# Patient Record
Sex: Female | Born: 1935 | Race: White | Hispanic: No | Marital: Single
Health system: Midwestern US, Academic
[De-identification: ages and names within clinical notes are randomized; demographics above are authoritative.]

---

## 2016-09-16 ENCOUNTER — Encounter: Admit: 2016-09-16 | Discharge: 2016-09-16 | Payer: MEDICARE | Primary: Family

## 2016-09-16 ENCOUNTER — Ambulatory Visit: Admit: 2016-09-16 | Discharge: 2016-09-17 | Payer: MEDICARE | Primary: Family

## 2016-09-16 DIAGNOSIS — K219 Gastro-esophageal reflux disease without esophagitis: ICD-10-CM

## 2016-09-16 DIAGNOSIS — I10 Essential (primary) hypertension: Principal | ICD-10-CM

## 2016-09-16 MED ORDER — HYDROCHLOROTHIAZIDE 25 MG PO TAB
25 mg | ORAL_TABLET | Freq: Every morning | ORAL | 3 refills | 28.00000 days | Status: AC
Start: 2016-09-16 — End: 2017-10-05

## 2016-09-16 NOTE — Progress Notes
Date of Service: 09/16/2016    Carmen Jones is a 82 y.o. female.       HPI     I had the pleasure of seeing Carmen Jones today for reevaluation of her hypertension.  She has a history of coronary artery disease post angioplasty and stenting of her LAD after presenting with a non-STEMI in 2013, hypercholesterolemia, palpitations, and insomnia.  She has been seen in our office several times try to get her blood pressure controlled.    At her last office visit Jul 10, 2016 her blood pressure was well controlled.  She continued to have a small amount of lower leg edema and was working hard at a low sodium diet.  She was also continuing to work at weight loss.  She also has a history of GERD.  This spring she had an endoscopy and colonoscopy and was told she had a slight erosion due to her hiatal hernia and was placed on Carafate.  She was asked to take an extra hydrochlorothiazide 12.5 mg if she had an increase in her lower leg edema.  She was also reminded to let our office know if she was taking this extra dose more than twice a week as we would check a BMP.    Carmen Jones called our office today to let us know that for the past several weeks she has been having blood pressures ranging in the 140s and 150s.  She was asked to come into the office for further evaluation.    Upon presentation to the office today Carmen Jones brought her home blood pressure records indeed showing that she does have frequent blood pressures ranging in the 140s and 150s systolic.  She notes she always sits and rests for 10 minutes before checking her blood pressure.  She often wakes up early in the morning around 430 or 530 and checks her blood pressure before she gets out of bed.  These tend to be some of her highest blood pressure readings.  She tells me that she is back at work in [employer] office because they have been short staffed.  She tells me is kind of a bothersome job.  There is a lot to do.  She also has not been exercising.  She does get about 2 miles of walking in a day just in a few steps here and there.  She does walk up 2 flights to her room and tolerates that fairly well.    She tells me that she very seldom has a mild heaviness across her chest.  The other day she had been out for dinner and when she was driving home she noticed a mild heaviness across her chest.  She tells me it feels a little like apprehension.  It did not take her breath away, cause nausea or diaphoresis.  She rated it as a 4-5 out of 10 in intensity.  She tells me she sat in her chair and rested and after about 45 minutes to just eased off.  She tells me it felt like indigestion just a little higher.  She notes that when she does have indigestion she takes an antacid and it calms down the feeling right away.  She tells me that was once she did have a heaviness like this in her chest that did make her a little short of air.  She continues to have some lower leg edema and tells me is a little worse than usual but she just forgot that she could take  an extra hydrochlorothiazide if needed.  She tells me she has not had any falls or hospitalizations since her last office visit    Assessment and plan    1.  Hypertension???elevated.  She is having fairly frequent blood pressures elevated into the 140s and 150s systolic.  She has been asked to increase her hydrochlorothiazide from 12.5 mg daily to 25 mg daily.  She has been given a lab order to check a BMP in 1 week.  She is not currently on potassium.  She is on losartan 100 mg daily.  We may need to add a potassium supplement depending on her lab results.  We again reviewed the importance of monitoring her sodium intake and keeping it less than 2000 mg daily.    2.  Coronary artery disease.  She has had a few mild episodes of heaviness across her chest.  They come on at rest.  She tells me it does feel like indigestion that she has not tried antacids to see if they would help. These episodes will be discussed with [name] and we may consider scheduling her for a stress test to evaluate for progression of coronary artery disease.  She continues on aspirin, atenolol, and losartan.  Her last regadenoson thallium stress test from August 2014 revealed an EF of 87% and no significant ischemia.    3.  Hyperlipidemia.  Her next labs will be due in October.  She continues on pravastatin 40 mg daily and tells me she is tolerating it without any problems.    4.  Lower leg edema???increased the increased hydrochlorothiazide should help improve her edema.  She will also work harder at a low sodium diet.    She is scheduled to follow-up in our office with [name] on August 17.  She will keep this appointment.    Thank you for the opportunity to participate in the care of this patient.  Please feel free to call us if you have any questions or concerns.    Loraine Leriche, APRN-C  DR[facility]       Vitals:    09/16/16 1351 09/16/16 1358   BP: 124/76 126/74   Pulse: 78    Weight: 78.1 kg (172 lb 3.2 oz)    Height: 1.676 m (5' 6)      Body mass index is 27.79 kg/m???.     Past Medical History  Patient Active Problem List    Diagnosis Date Noted   ??? Primary insomnia 11/27/2014   ??? Mixed hyperlipidemia 11/15/2013   ??? Arteriosclerotic coronary artery disease 12/01/2011     a.  10/2011  Chest pain.  transfer form [facility] with NSTEMI          Angiogram: LAD mid 95%, LCX-OMB1 40%, dom RCA 95%.     PCI with 3.0x18 Xience DES in LAD and 3.0x15 Xience DES in RCA       08/14 regadenoson thall:  EF 87%, no ischemia, normal scan     ??? NSTEMI (non-ST elevated myocardial infarction) (HCC) 11/06/2011   ??? Essential hypertension 11/06/2011   ??? GERD (gastroesophageal reflux disease) 11/06/2011         Review of Systems   Constitution: Negative.   HENT: Negative.    Eyes: Negative.    Cardiovascular: Positive for leg swelling.   Respiratory: Negative.    Endocrine: Negative.    Hematologic/Lymphatic: Negative. Skin: Negative.    Musculoskeletal: Negative.    Gastrointestinal: Negative.    Genitourinary: Negative.  Neurological: Negative.    Psychiatric/Behavioral: Negative.    Allergic/Immunologic: Negative.    She denies having palpitations, PND, orthopnea, lightheadedness, dizziness, pre-syncope, syncope, coughing, wheezing, shortness of air, dyspnea, sputum production, or hemoptysis. She uses no tobacco products.      Physical Exam      General Appearance: resting comfortably, no acute distress  Skin: warm, moist, no ulcers or xanthomas  Digits and Nails: no clubbing  Eyes: conjunctivae and lids normal, pupils are equal and round  Lips & Oral Mucosa: no pallor or cyanosis  Ear, Nose, Throat: No deformities  Neck Veins: neck veins are flat, neck veins are not distended  Thyroid: no nodules, masses, tenderness or enlargement  Chest Inspection: chest is normal in appearance  Respiratory Effort: breathing is unlabored, no respiratory distress  Auscultation/Percussion: lungs clear to auscultation, no rales, rhonchi, or wheezing  PMI: PMI not enlarged or displaced  Cardiac Rhythm: regular rhythm and normal rate  Cardiac Auscultation: Normal S1 & S2, no S3 or S4, no rub  Murmurs: no cardiac murmurs   Carotid Arteries: normal carotid upstroke bilaterally, no bruits  Pedal Pulses: normal symmetric pedal pulses  Lower Extremity Edema: scant bilateral lower extremity edema  Abdominal Exam: soft, non-tender, no masses, bowel sounds normal  Abdominal Aorta: nonpalpable abdominal aorta; no abdominal bruits  Liver & Spleen: no organomegaly  Gait & Station: normal balance and gait  Muscle Strength: normal strength and tone  Neurologic Exam: neurological assessment grossly intact  Orientation: oriented to time, place and person  Affect & Mood: appropriate and sustained affect  Other: moves all extremities            Problems Addressed Today  No diagnosis found.                  Current Medications (including today's revisions) ??? aspirin EC 81 mg tablet Take 81 mg by mouth daily.   ??? atenolol (TENORMIN) 25 mg tablet Take 0.5 tablets by mouth at bedtime daily.   ??? cholecalciferol (Vitamin D3) (VITAMIN D-3) 1,000 units tablet Take 1,000 Units by mouth daily.   ??? docusate (COLACE) 100 mg capsule Take 200 mg by mouth every morning.   ??? esomeprazole DR(+) (NEXIUM) 40 mg capsule Take 40 mg by mouth every morning. Take on an empty stomach at least 1 hour before or 2 hours after food.   ??? felodipine(+) (PLENDIL) 5 mg ER tablet TAKE 1 TABLET BY MOUTH DAILY. TAKE ON AN EMPTY STOMACH.   ??? ferrous sulfate (FEOSOL, FEROSUL) 325 mg (65 mg iron) tablet Take 325 mg by mouth daily.   ??? hydroCHLOROthiazide (HYDRODIURIL) 25 mg tablet Take 12.5 mg by mouth every morning.   ??? IBUPROFEN (ADVIL PO) Take  by mouth as Needed.   ??? losartan (COZAAR) 50 mg tablet Take 1 tablet by mouth twice daily.   ??? magnesium oxide (MAG-OX) 400 mg tablet Take 400 mg by mouth twice daily.   ??? NAPROXEN SODIUM (ALEVE PO) Take  by mouth as Needed.   ??? pravastatin (PRAVACHOL) 40 mg tablet TAKE 1 TABLET BY MOUTH EVERY DAY   ??? temazepam (RESTORIL) 15 mg capsule Take 15 mg by mouth at bedtime as needed.

## 2016-09-16 NOTE — Telephone Encounter
Received msg on triage line from pt stating APRN Fannie Knee asked pt to call if she had any problems with her blood pressure and it has been running high and she wanted to see Fannie Knee in f/u. Pt mentions she does have upcoming OV with [name] [facility]) and if she cannot get in with Fannie Knee prior to that appt, then she will just keept that appt. Pt left home # for call back.     Spoke to Brittinie Sare in f/u and she is agreeable to come to our [place] office today to see Fannie Knee at 2 PM. She states her BP has been running higher the last few weeks and "I probably should have called sooner." She denies any med changes and/or hospitalizations since we saw her last in May. Pt states BP 150s systolic "and the bottom # is fine." Pt to bring home BP log with her for APRN review. Pt verbalized understanding and was agreeable to this plan. No further needs identified at this time.

## 2016-09-23 LAB — BASIC METABOLIC PANEL
Lab: 0.7
Lab: 10
Lab: 102
Lab: 13
Lab: 131 — ABNORMAL LOW (ref 136–145)
Lab: 15 — ABNORMAL HIGH (ref 0–14)
Lab: 26
Lab: 4.2
Lab: 76
Lab: 87
Lab: 94 — ABNORMAL LOW (ref 98–107)

## 2016-09-25 ENCOUNTER — Encounter: Admit: 2016-09-25 | Discharge: 2016-09-25 | Payer: MEDICARE | Primary: Family

## 2016-09-25 DIAGNOSIS — R0789 Other chest pain: Principal | ICD-10-CM

## 2016-09-25 NOTE — Telephone Encounter
Talked with pt.  I have concern for a 10:15 appointment time and then having enough time to do a nuc reg afterwards.  Pt has agreed to move her OV time to 0845 with [name].  We will put her on the nuc schedule for 0930.      She was given regadenoson MPI explanation and preparation instructions and she verbalized understanding.

## 2016-09-25 NOTE — Telephone Encounter
-----   Message from Courtney Heys, RN sent at 09/25/2016 12:55 PM CDT -----  Regarding: FW: Please call pt and instruct her for a Reg Thallium stress test      ----- Message -----  From: Harle Battiest, APRN  Sent: 09/25/2016  12:02 PM  To: Suzanne Boron Nurse [place]  Subject: Please call pt and instruct her for a Reg Th#    I have called pt and let her know we will schedule her for a Reg Thallium stress test on 10/03/16 right after her OV with DRB to eval her CP. She is in agreement.    She has not had improvement in her lower leg edema with the increased HCTZ dose. I asked her to begin wearing support hose.    Please call her and give Reg thallium instructions. I have put in the order.  Fayette Pho  ----- Message -----  From: Roena Malady, MD  Sent: 09/22/2016   8:17 AM  To: Harle Battiest, APRN  Subject: RE: Do you recommend a Reg Thallium stress t#    Go ahead and order the reg thall after my OV same day    ----- Message -----  From: Harle Battiest, APRN  Sent: 09/16/2016   2:49 PM  To: Roena Malady, MD  Subject: Do you recommend a Reg Thallium stress test?     Thedore Mins,    I saw Britlyn today as a work in for HTN. She has seldom episodes of mild chest heaviness. She will see you 10/03/16 for regular f/u.    I did not order a stress test but wanted you to review. Do you want me to go ahead and order it or wait and eval when you see her? Symptoms are mild, she also has GERD, but has a hx of CAD post stents.    Collie Siad

## 2016-10-03 ENCOUNTER — Encounter: Admit: 2016-10-03 | Discharge: 2016-10-03 | Payer: MEDICARE | Primary: Family

## 2016-10-03 ENCOUNTER — Ambulatory Visit: Admit: 2016-10-03 | Discharge: 2016-10-04 | Payer: MEDICARE | Primary: Family

## 2016-10-03 DIAGNOSIS — R0789 Other chest pain: Principal | ICD-10-CM

## 2016-10-03 DIAGNOSIS — E782 Mixed hyperlipidemia: ICD-10-CM

## 2016-10-03 DIAGNOSIS — I2510 Atherosclerotic heart disease of native coronary artery without angina pectoris: Principal | ICD-10-CM

## 2016-10-03 DIAGNOSIS — I10 Essential (primary) hypertension: ICD-10-CM

## 2016-10-03 DIAGNOSIS — K219 Gastro-esophageal reflux disease without esophagitis: ICD-10-CM

## 2016-10-03 MED ORDER — NITROGLYCERIN 0.4 MG SL SUBL
.4 mg | SUBLINGUAL | 0 refills | Status: AC | PRN
Start: 2016-10-03 — End: ?

## 2016-10-03 MED ORDER — SODIUM CHLORIDE 0.9 % IV SOLP
250 mL | INTRAVENOUS | 0 refills | Status: AC | PRN
Start: 2016-10-03 — End: ?

## 2016-10-03 MED ORDER — ALBUTEROL SULFATE 90 MCG/ACTUATION IN HFAA
2 | RESPIRATORY_TRACT | 0 refills | Status: DC | PRN
Start: 2016-10-03 — End: 2016-10-08

## 2016-10-03 MED ORDER — REGADENOSON 0.4 MG/5 ML IV SYRG
.4 mg | Freq: Once | INTRAVENOUS | 0 refills | Status: CP
Start: 2016-10-03 — End: ?

## 2016-10-03 MED ORDER — AMINOPHYLLINE 500 MG/20 ML IV SOLN
50 mg | INTRAVENOUS | 0 refills | Status: AC | PRN
Start: 2016-10-03 — End: ?

## 2016-10-03 NOTE — Progress Notes
Peripheral IV Insertion Note:  Patient Side: left  Line Orientation:Hand  IV Catheter Size: 22G  Number of Attempts:1.  IV capped and flushed with Normal Saline.  IV site without redness, swelling, or pain.  New dressing placed.    After procedure IV cannula removed intact and hemostasis achieved.

## 2016-10-03 NOTE — Progress Notes
Date of Service: 10/03/2016    Carmen Jones is a 82 y.o. female.       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 ???    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  She is asked to be experiencing some chest pain.  She was seen a couple of times recently by 1 of our nurse practitioner for control of her blood pressure which is now better.  With this she is also experiencing some chest pain.  The pain tends to come when she is more stressed.  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  She also stressful about the illness and some of her friends.  When she is worried about these things she gets a burning type of pain or hurting across the left side of her chest.  When more severe goes into her breast region.  She has not had the symptoms with exertion.  She has not had syncope or near syncope    She has a history of coronary disease.  She underwent angioplasty and stenting of her LAD after and in STEMI in 2013.  Her last stress was negative in 2014.  Reaction symptoms were not repeated her stress test.  Her risk factor control has been excellent.    Recent ECG [place] office was unremarkable    Impression  1.  Chest pain.  Concerning for possible recurrence of angina.  Injury be induced by stress and anxiety.  2.  Coronary disease with previous intervention 2013  3.  Hypertension not controlled  4.  Hypertension controlled    Recommendations  A thallium scan was scheduled for today.  Will call her with results.       Vitals:    10/03/16 0840   BP: 132/80   Pulse: 71   Weight: 75.8 kg (167 lb)   Height: 1.676 m (5' 6)     Body mass index is 26.95 kg/m???.     Past Medical History  Patient Active Problem List Diagnosis Date Noted   ??? Other chest pain 09/25/2016   ??? Primary insomnia 11/27/2014   ??? Mixed hyperlipidemia 11/15/2013   ??? Arteriosclerotic coronary artery disease 12/01/2011     a.  10/2011  Chest pain.  transfer [facility] with NSTEMI          Angiogram: LAD mid 95%, LCX-OMB1 40%, dom RCA 95%.     PCI with 3.0x18 Xience DES in LAD and 3.0x15 Xience DES in RCA       08/14 regadenoson thall:  EF 87%, no ischemia, normal scan     ??? NSTEMI (non-ST elevated myocardial infarction) (HCC) 11/06/2011   ??? Essential hypertension 11/06/2011   ??? GERD (gastroesophageal reflux disease) 11/06/2011         Review of Systems   Constitution: Negative.   HENT: Negative.    Eyes: Negative.    Cardiovascular: Negative.    Respiratory: Negative.    Endocrine: Negative.    Hematologic/Lymphatic: Negative.    Skin: Negative.    Musculoskeletal: Negative.    Gastrointestinal: Negative.    Genitourinary: Negative.    Neurological: Negative.    Psychiatric/Behavioral: Negative.    Allergic/Immunologic: Negative.  Physical Exam   General Appearance:???no acute distress   Skin:???warm, moist, no ulcers   HEENT:???unremarkable   Neck Veins:???neck veins are flat, neck veins are not distended   Carotid Arteries:???normal carotid upstroke bilaterally, no bruits   Chest Inspection:???chest is normal in appearance   Auscultation/Percussion:???lungs clear to auscultation, no rales, rhonchi, or wheezing   Cardiac Rhythm:???regular rhythm and normal rate   Cardiac Auscultation:???Normal S1 &???S2, no S3 or S4, no rub   Murmurs:???no cardiac murmurs   Extremities:???no lower extremity edema; 2+ symmetric distal pulses   Abdominal Exam:???soft, non-tender, no masses, bowel sounds normal   Liver & Spleen:???no organomegaly   Neurologic Exam:???neurological assessment grossly intact        Current Medications (including today's revisions)  ??? aspirin EC 81 mg tablet Take 81 mg by mouth daily.   ??? atenolol (TENORMIN) 25 mg tablet Take 0.5 tablets by mouth at bedtime daily.   ??? cholecalciferol (Vitamin D3) (VITAMIN D-3) 1,000 units tablet Take 1,000 Units by mouth daily.   ??? docusate (COLACE) 100 mg capsule Take 200 mg by mouth every morning.   ??? esomeprazole DR(+) (NEXIUM) 40 mg capsule Take 40 mg by mouth every morning. Take on an empty stomach at least 1 hour before or 2 hours after food.   ??? felodipine(+) (PLENDIL) 5 mg ER tablet TAKE 1 TABLET BY MOUTH DAILY. TAKE ON AN EMPTY STOMACH.   ??? ferrous sulfate (FEOSOL, FEROSUL) 325 mg (65 mg iron) tablet Take 325 mg by mouth daily.   ??? hydroCHLOROthiazide (HYDRODIURIL) 25 mg tablet Take 1 tablet by mouth every morning.   ??? IBUPROFEN (ADVIL PO) Take  by mouth as Needed.   ??? losartan (COZAAR) 50 mg tablet Take 1 tablet by mouth twice daily.   ??? magnesium oxide (MAG-OX) 400 mg tablet Take 400 mg by mouth twice daily.   ??? NAPROXEN SODIUM (ALEVE PO) Take  by mouth as Needed.   ??? pravastatin (PRAVACHOL) 40 mg tablet TAKE 1 TABLET BY MOUTH EVERY DAY   ??? temazepam (RESTORIL) 15 mg capsule Take 15 mg by mouth at bedtime as needed.

## 2016-10-07 ENCOUNTER — Encounter: Admit: 2016-10-07 | Discharge: 2016-10-07 | Payer: MEDICARE | Primary: Family

## 2016-10-07 NOTE — Telephone Encounter
-----   Message from Loraine Leriche, APRN sent at 10/06/2016  5:09 PM CDT -----  Nurses,  Please let Naydelin know thallium stress test looks good.    Dak,  Thanks for sharing Keeshia with me!    Fannie Knee  ----- Message -----  From: Milon Score, MD  Sent: 10/06/2016   1:31 PM  To: Loraine Leriche, APRN

## 2016-10-07 NOTE — Telephone Encounter
Results and recommendations called to patient left detailed message on phone requested call back if questions.

## 2016-10-14 ENCOUNTER — Encounter: Admit: 2016-10-14 | Discharge: 2016-10-14 | Payer: MEDICARE | Primary: Family

## 2016-11-18 ENCOUNTER — Encounter: Admit: 2016-11-18 | Discharge: 2016-11-18 | Payer: MEDICARE | Primary: Family

## 2016-11-18 DIAGNOSIS — I10 Essential (primary) hypertension: Principal | ICD-10-CM

## 2016-12-04 ENCOUNTER — Encounter: Admit: 2016-12-04 | Discharge: 2016-12-04 | Payer: MEDICARE | Primary: Family

## 2016-12-04 ENCOUNTER — Ambulatory Visit: Admit: 2016-12-04 | Discharge: 2016-12-05 | Payer: MEDICARE | Primary: Family

## 2016-12-04 DIAGNOSIS — I10 Essential (primary) hypertension: Principal | ICD-10-CM

## 2016-12-04 DIAGNOSIS — K219 Gastro-esophageal reflux disease without esophagitis: ICD-10-CM

## 2016-12-04 DIAGNOSIS — E782 Mixed hyperlipidemia: ICD-10-CM

## 2016-12-04 DIAGNOSIS — I2510 Atherosclerotic heart disease of native coronary artery without angina pectoris: ICD-10-CM

## 2016-12-04 NOTE — Progress Notes
Date of Service: 12/04/2016    Carmen Jones is a 82 y.o. female.       HPI     I had the pleasure of seeing Carmen Jones today for reevaluation of her coronary artery disease, hypertension, hyperlipidemia, palpitations, and lower leg edema.  She has a history of a non-STEMI in 2013 followed by angioplasty and stenting of her LAD.  She had developed a mild heaviness in her chest over the summer and this was evaluated with a regadenoson MPI stress test on October 03, 2016.  This study revealed an EF of 90% and no significant myocardial ischemia.    Carmen Jones presents to the office today telling me that since she saw [name] on October 03, 2016 she has been getting along well.  She has not had any recurrence of the chest pain since her stress test.  She notes she has to work harder at a low salt diet.  Even when she eats small amounts of salt she notices increased lower leg edema.  She is currently wearing support hose and tells me when she wears the support hose and keeps her sodium intake low she does not have any lower leg edema.  She recently had her flu shot.  She denies any hospitalizations or falls since her last office visit.    Her most recent labs from September 23, 2016 are listed below.    Results for Carmen Jones, Carmen Jones (MRN [id number]) as of 12/04/2016 10:00   Ref. Range 03/05/2016 00:00 07/16/2016 00:00 09/23/2016 00:00   Sodium Latest Ref Range: 136 - 145  138  131 (L)   Potassium Unknown 4.4  4.2   Chloride Latest Ref Range: 98 - 107  104  94 (L)   CO2 Unknown 25.0  26.0   Anion Gap Latest Ref Range: 0 - 14  13  15  (H)   Blood Urea Nitrogen Unknown 16.0  13.0   Creatinine Unknown 0.77  0.75   eGFR Non African American Unknown *  76.1   eGFR African American Unknown *  87.7   Glucose Unknown 115 (H)  102   Calcium Unknown 9.6  10.0     Assessment and plan    1.  Coronary artery disease???stable.  Her recent regadenoson thallium stress test revealed a normal EF and no significant myocardial ischemia. She denies any episodes of chest discomfort since the stress test.  She continues on aspirin, atenolol, and losartan.  She is encouraged to stay active.    2.  Hypertension???stable.  She tells me that previously if she ate a little more salt she was trying to drink more fluids to help flush the salt out of her system.  This may account for her low sodium of 131 on September 23, 2016.  She is been encouraged to work hard at a low sodium diet and not over drinking to compensate.  She is been given a lab order to recheck a BMP in the next few days.  We will call her with these results.    3.  Hyperlipidemia.  Her last labs from 1 year ago were at goal.  She tells me she is tolerating her pravastatin 40 mg daily without any problems.  She is been given a lab order to recheck a fasting lipid profile, AST, and ALT in the next few days.  We will call her with these results.    4.  Lower leg edema???stable.  With the increased dose of hydrochlorothiazide and wearing support hose  and a lower salt diet she does not have any lower leg edema.    She will plan to follow-up in our office with [name] in 6 months.    Thank you for the opportunity to participate in the care of this patient.  Please feel free to call us if you have any questions or concerns    Loraine Leriche, APRN-C  DRB             Vitals:    12/04/16 0749 12/04/16 0757   BP: 120/82 124/80   Pulse: 76    Weight: 76.2 kg (168 lb)    Height: 1.676 m (5' 6)      Body mass index is 27.12 kg/m???.     Past Medical History  Patient Active Problem List    Diagnosis Date Noted   ??? Other chest pain 09/25/2016   ??? Primary insomnia 11/27/2014   ??? Mixed hyperlipidemia 11/15/2013   ??? Arteriosclerotic coronary artery disease 12/01/2011     a.  10/2011  Chest pain.  transfer [facility] with NSTEMI          Angiogram: LAD mid 95%, LCX-OMB1 40%, dom RCA 95%.     PCI with 3.0x18 Xience DES in LAD and 3.0x15 Xience DES in RCA 08/14 regadenoson thall:  EF 87%, no ischemia, normal scan     ??? NSTEMI (non-ST elevated myocardial infarction) (HCC) 11/06/2011   ??? Essential hypertension 11/06/2011   ??? GERD (gastroesophageal reflux disease) 11/06/2011         Review of Systems   Constitution: Positive for malaise/fatigue.   HENT: Positive for tinnitus.    Eyes: Negative.    Cardiovascular: Positive for leg swelling.   Respiratory: Negative.    Endocrine: Negative.    Hematologic/Lymphatic: Negative.    Skin: Negative.    Musculoskeletal: Positive for back pain and myalgias.   Gastrointestinal: Negative.    Genitourinary: Negative.    Neurological: Negative.    Psychiatric/Behavioral: Negative.    Allergic/Immunologic: Negative.    She denies having chest pain, palpitations, PND, orthopnea, lightheadedness, dizziness, pre-syncope, syncope, coughing, wheezing, shortness of air, dyspnea, sputum production, hemoptysis, or edema. She uses no tobacco products.      Physical Exam    General Appearance: resting comfortably, no acute distress  Skin: warm, moist, no ulcers or xanthomas  Digits and Nails: no clubbing  Eyes: conjunctivae and lids normal, pupils are equal and round  Lips & Oral Mucosa: no pallor or cyanosis  Ear, Nose, Throat: No deformities  Thyroid: no nodules, masses, tenderness or enlargement  Chest Inspection: chest is normal in appearance  Respiratory Effort: breathing is unlabored, no respiratory distress  Auscultation/Percussion: lungs clear to auscultation, no rales, rhonchi, or wheezing  PMI: PMI not enlarged or displaced  Cardiac Rhythm: regular rhythm and normal rate  Cardiac Auscultation: Normal S1 & S2, no S3 or S4, no rub  Murmurs: no cardiac murmurs   Carotid Arteries: normal carotid upstroke bilaterally, no bruits  Pedal Pulses: normal symmetric pedal pulses  Lower Extremity Edema: no lower extremity edema, wearing support hose  Abdominal Exam: soft, non-tender, no masses, bowel sounds normal Abdominal Aorta: nonpalpable abdominal aorta; no abdominal bruits  Liver & Spleen: no organomegaly  Gait & Station: normal balance and gait  Muscle Strength: normal strength and tone  Neurologic Exam: neurological assessment grossly intact  Orientation: oriented to time, place and person  Affect & Mood: appropriate and sustained affect  Other: moves all extremities  Problems Addressed Today  No diagnosis found.                  Current Medications (including today's revisions)  ??? aspirin EC 81 mg tablet Take 81 mg by mouth daily.   ??? atenolol (TENORMIN) 25 mg tablet Take 0.5 tablets by mouth at bedtime daily.   ??? cholecalciferol (Vitamin D3) (VITAMIN D-3) 1,000 units tablet Take 1,000 Units by mouth daily.   ??? docusate (COLACE) 100 mg capsule Take 200 mg by mouth every morning.   ??? esomeprazole DR(+) (NEXIUM) 40 mg capsule Take 40 mg by mouth every morning. Take on an empty stomach at least 1 hour before or 2 hours after food.   ??? felodipine(+) (PLENDIL) 5 mg ER tablet TAKE 1 TABLET BY MOUTH DAILY. TAKE ON AN EMPTY STOMACH.   ??? ferrous sulfate (FEOSOL, FEROSUL) 325 mg (65 mg iron) tablet Take 325 mg by mouth daily.   ??? hydroCHLOROthiazide (HYDRODIURIL) 25 mg tablet Take 1 tablet by mouth every morning.   ??? IBUPROFEN (ADVIL PO) Take  by mouth as Needed.   ??? losartan (COZAAR) 50 mg tablet Take 1 tablet by mouth twice daily.   ??? magnesium oxide (MAG-OX) 400 mg tablet Take 400 mg by mouth twice daily.   ??? NAPROXEN SODIUM (ALEVE PO) Take  by mouth as Needed.   ??? pravastatin (PRAVACHOL) 40 mg tablet TAKE 1 TABLET BY MOUTH EVERY DAY   ??? temazepam (RESTORIL) 15 mg capsule Take 15 mg by mouth at bedtime as needed.

## 2016-12-15 LAB — LIPID PROFILE
Lab: 171 U/L (ref 7–40)
Lab: 18 mL/min — ABNORMAL LOW (ref >60)
Lab: 3
Lab: 57 U/L (ref 7–56)
Lab: 91 MMOL/L (ref 21–30)
Lab: 96 K/UL (ref 3–12)

## 2016-12-15 LAB — BASIC METABOLIC PANEL
Lab: 13
Lab: 135 U/L — ABNORMAL LOW (ref 136–145)

## 2016-12-15 LAB — AST (SGOT): Lab: 14

## 2016-12-15 LAB — ALT (SGPT): Lab: 14

## 2016-12-29 ENCOUNTER — Encounter: Admit: 2016-12-29 | Discharge: 2016-12-29 | Payer: MEDICARE | Primary: Family

## 2016-12-29 DIAGNOSIS — I10 Essential (primary) hypertension: Principal | ICD-10-CM

## 2016-12-29 DIAGNOSIS — E782 Mixed hyperlipidemia: ICD-10-CM

## 2017-01-31 ENCOUNTER — Encounter: Admit: 2017-01-31 | Discharge: 2017-01-31 | Payer: MEDICARE | Primary: Family

## 2017-02-02 MED ORDER — PRAVASTATIN 40 MG PO TAB
ORAL_TABLET | Freq: Every day | ORAL | 3 refills | 90.00000 days | Status: AC
Start: 2017-02-02 — End: 2018-01-28

## 2017-02-07 ENCOUNTER — Encounter: Admit: 2017-02-07 | Discharge: 2017-02-07 | Payer: MEDICARE | Primary: Family

## 2017-02-07 DIAGNOSIS — I10 Essential (primary) hypertension: Principal | ICD-10-CM

## 2017-02-09 MED ORDER — LOSARTAN 50 MG PO TAB
ORAL_TABLET | Freq: Two times a day (BID) | ORAL | 2 refills | 30.00000 days | Status: AC
Start: 2017-02-09 — End: 2018-02-05

## 2017-02-09 MED ORDER — FELODIPINE 5 MG PO TB24
5 mg | ORAL_TABLET | Freq: Every day | ORAL | 3 refills | 90.00000 days | Status: AC
Start: 2017-02-09 — End: 2018-02-05

## 2017-04-18 ENCOUNTER — Encounter: Admit: 2017-04-18 | Discharge: 2017-04-18 | Payer: MEDICARE | Primary: Family

## 2017-04-20 MED ORDER — ATENOLOL 25 MG PO TAB
12.5 mg | ORAL_TABLET | Freq: Every evening | ORAL | 3 refills | 33.00000 days | Status: AC
Start: 2017-04-20 — End: 2018-04-02

## 2017-06-15 ENCOUNTER — Encounter: Admit: 2017-06-15 | Discharge: 2017-06-15 | Payer: MEDICARE | Primary: Family

## 2017-06-15 ENCOUNTER — Ambulatory Visit: Admit: 2017-06-15 | Discharge: 2017-06-16 | Payer: MEDICARE | Primary: Family

## 2017-06-15 DIAGNOSIS — I10 Essential (primary) hypertension: Principal | ICD-10-CM

## 2017-06-15 DIAGNOSIS — F5101 Primary insomnia: ICD-10-CM

## 2017-06-15 DIAGNOSIS — K219 Gastro-esophageal reflux disease without esophagitis: ICD-10-CM

## 2017-06-15 DIAGNOSIS — E782 Mixed hyperlipidemia: Principal | ICD-10-CM

## 2017-06-15 DIAGNOSIS — I2510 Atherosclerotic heart disease of native coronary artery without angina pectoris: ICD-10-CM

## 2017-06-16 ENCOUNTER — Encounter: Admit: 2017-06-16 | Discharge: 2017-06-16 | Payer: MEDICARE | Primary: Family

## 2017-06-16 DIAGNOSIS — K219 Gastro-esophageal reflux disease without esophagitis: ICD-10-CM

## 2017-06-16 DIAGNOSIS — I10 Essential (primary) hypertension: Principal | ICD-10-CM

## 2017-10-03 ENCOUNTER — Encounter: Admit: 2017-10-03 | Discharge: 2017-10-03 | Payer: MEDICARE | Primary: Family

## 2017-10-03 DIAGNOSIS — I10 Essential (primary) hypertension: Principal | ICD-10-CM

## 2017-10-05 MED ORDER — HYDROCHLOROTHIAZIDE 25 MG PO TAB
ORAL_TABLET | Freq: Every day | 3 refills | Status: SS
Start: 2017-10-05 — End: 2018-10-18

## 2018-01-28 ENCOUNTER — Encounter: Admit: 2018-01-28 | Discharge: 2018-01-28 | Payer: MEDICARE | Primary: Family

## 2018-01-28 DIAGNOSIS — E782 Mixed hyperlipidemia: Principal | ICD-10-CM

## 2018-01-28 MED ORDER — PRAVASTATIN 40 MG PO TAB
ORAL_TABLET | Freq: Every day | ORAL | 0 refills | 90.00000 days | Status: AC
Start: 2018-01-28 — End: 2018-02-01

## 2018-01-30 ENCOUNTER — Encounter: Admit: 2018-01-30 | Discharge: 2018-01-30 | Payer: MEDICARE | Primary: Family

## 2018-02-01 MED ORDER — PRAVASTATIN 40 MG PO TAB
ORAL_TABLET | Freq: Every day | 3 refills | Status: SS
Start: 2018-02-01 — End: ?

## 2018-02-05 ENCOUNTER — Encounter: Admit: 2018-02-05 | Discharge: 2018-02-05 | Payer: MEDICARE | Primary: Family

## 2018-02-05 DIAGNOSIS — I10 Essential (primary) hypertension: Principal | ICD-10-CM

## 2018-02-05 MED ORDER — LOSARTAN 50 MG PO TAB
ORAL_TABLET | Freq: Two times a day (BID) | 3 refills | Status: SS
Start: 2018-02-05 — End: ?

## 2018-02-05 MED ORDER — FELODIPINE 5 MG PO TB24
5 mg | ORAL_TABLET | Freq: Every day | ORAL | 3 refills | Status: SS
Start: 2018-02-05 — End: ?

## 2018-02-05 MED ORDER — LOSARTAN 50 MG PO TAB
ORAL_TABLET | Freq: Two times a day (BID) | ORAL | 3 refills | 30.00000 days | Status: AC
Start: 2018-02-05 — End: 2018-02-05

## 2018-03-10 ENCOUNTER — Encounter: Admit: 2018-03-10 | Discharge: 2018-03-10 | Payer: MEDICARE | Primary: Family

## 2018-03-10 DIAGNOSIS — E782 Mixed hyperlipidemia: Secondary | ICD-10-CM

## 2018-03-10 LAB — LIPID PROFILE
Lab: 56
Lab: 161
Lab: 20
Lab: 3
Lab: 96
Lab: 98

## 2018-03-16 ENCOUNTER — Encounter: Admit: 2018-03-16 | Discharge: 2018-03-16 | Payer: MEDICARE | Primary: Family

## 2018-03-16 ENCOUNTER — Ambulatory Visit: Admit: 2018-03-16 | Discharge: 2018-03-17 | Payer: MEDICARE | Primary: Family

## 2018-03-16 DIAGNOSIS — I10 Essential (primary) hypertension: Secondary | ICD-10-CM

## 2018-03-16 DIAGNOSIS — K219 Gastro-esophageal reflux disease without esophagitis: Secondary | ICD-10-CM

## 2018-03-16 DIAGNOSIS — E782 Mixed hyperlipidemia: Secondary | ICD-10-CM

## 2018-03-16 DIAGNOSIS — I2510 Atherosclerotic heart disease of native coronary artery without angina pectoris: Secondary | ICD-10-CM

## 2018-03-17 ENCOUNTER — Encounter: Admit: 2018-03-17 | Discharge: 2018-03-17 | Payer: MEDICARE | Primary: Family

## 2018-04-02 ENCOUNTER — Encounter: Admit: 2018-04-02 | Discharge: 2018-04-02 | Payer: MEDICARE | Primary: Family

## 2018-04-02 MED ORDER — ATENOLOL 25 MG PO TAB
12.5 mg | ORAL_TABLET | Freq: Every evening | ORAL | 3 refills | Status: SS
Start: 2018-04-02 — End: ?

## 2018-10-11 ENCOUNTER — Encounter: Admit: 2018-10-11 | Discharge: 2018-10-11 | Primary: Family

## 2018-10-11 DIAGNOSIS — M5417 Radiculopathy, lumbosacral region: Secondary | ICD-10-CM

## 2018-10-11 DIAGNOSIS — R079 Chest pain, unspecified: Principal | ICD-10-CM

## 2018-10-12 ENCOUNTER — Encounter: Admit: 2018-10-12 | Discharge: 2018-10-12 | Primary: Family

## 2018-10-12 ENCOUNTER — Encounter: Admit: 2018-10-18 | Discharge: 2018-10-18

## 2018-10-12 DIAGNOSIS — K219 Gastro-esophageal reflux disease without esophagitis: Secondary | ICD-10-CM

## 2018-10-12 DIAGNOSIS — I10 Essential (primary) hypertension: Secondary | ICD-10-CM

## 2018-10-12 DIAGNOSIS — R079 Chest pain, unspecified: Principal | ICD-10-CM

## 2018-10-12 LAB — COMPREHENSIVE METABOLIC PANEL
Lab: 0.7 mg/dL (ref 0.4–1.00)
Lab: 0.8 mg/dL (ref 0.3–1.2)
Lab: 11 U/L (ref 7–56)
Lab: 127 MMOL/L — ABNORMAL LOW (ref 137–147)
Lab: 15 mg/dL (ref 7–25)
Lab: 26 MMOL/L (ref 21–30)
Lab: 4.1 g/dL — ABNORMAL HIGH (ref 3.5–5.0)
Lab: 6.7 g/dL (ref 6.0–8.0)
Lab: 9 10*3/uL — ABNORMAL HIGH (ref 3–12)
Lab: 9.5 mg/dL (ref 8.5–10.6)
Lab: >60 mL/min (ref >60)
Lab: >60 mL/min — ABNORMAL HIGH (ref >60)

## 2018-10-12 LAB — PHOSPHORUS: Lab: 4.1 mg/dL — ABNORMAL LOW (ref 2.0–4.5)

## 2018-10-12 LAB — CBC AND DIFF
Lab: 0 10*3/uL (ref 0–0.20)
Lab: 0 10*3/uL (ref 0–0.45)
Lab: 12 10*3/uL — ABNORMAL HIGH (ref 4.5–11.0)

## 2018-10-12 LAB — COVID-19 (SARS-COV-2) PCR

## 2018-10-12 LAB — TROPONIN-I: Lab: 0 ng/mL — ABNORMAL HIGH (ref 0.0–0.05)

## 2018-10-12 LAB — MAGNESIUM: Lab: 1.5 mg/dL — ABNORMAL LOW (ref 1.6–2.6)

## 2018-10-12 LAB — OSMOLALITY: Lab: 267 mosm/kg — ABNORMAL LOW (ref 280–307)

## 2018-10-12 MED ORDER — LIDOCAINE 5 % TP PTMD
1 | Freq: Every day | TOPICAL | 0 refills | Status: DC
Start: 2018-10-12 — End: 2018-10-18
  Administered 2018-10-12 – 2018-10-17 (×5): 1 via TOPICAL

## 2018-10-12 MED ORDER — PATCH DOCUMENTATION - LIDOCAINE 5%
Freq: Two times a day (BID) | TRANSDERMAL | 0 refills | Status: DC
Start: 2018-10-12 — End: 2018-10-18

## 2018-10-12 MED ORDER — PRAVASTATIN 40 MG PO TAB
40 mg | Freq: Every day | ORAL | 0 refills | Status: DC
Start: 2018-10-12 — End: 2018-10-18
  Administered 2018-10-12 – 2018-10-18 (×7): 40 mg via ORAL

## 2018-10-12 MED ORDER — DOCUSATE SODIUM 100 MG PO CAP
200 mg | Freq: Every morning | ORAL | 0 refills | Status: DC
Start: 2018-10-12 — End: 2018-10-18
  Administered 2018-10-13 – 2018-10-18 (×5): 200 mg via ORAL

## 2018-10-12 MED ORDER — MAGNESIUM OXIDE 400 MG (241.3 MG MAGNESIUM) PO TAB
400 mg | Freq: Two times a day (BID) | ORAL | 0 refills | Status: DC
Start: 2018-10-12 — End: 2018-10-18
  Administered 2018-10-12 – 2018-10-18 (×14): 400 mg via ORAL

## 2018-10-12 MED ORDER — HYDROCHLOROTHIAZIDE 25 MG PO TAB
25 mg | Freq: Every morning | ORAL | 0 refills | Status: DC
Start: 2018-10-12 — End: 2018-10-12
  Administered 2018-10-12: 13:00:00 25 mg via ORAL

## 2018-10-12 MED ORDER — PATCH DOCUMENTATION - LIDOCAINE 5%
TRANSDERMAL | 0 refills | Status: DC
Start: 2018-10-12 — End: 2018-10-12

## 2018-10-12 MED ORDER — LIDOCAINE 5 % TP PTMD
1 | Freq: Every day | TOPICAL | 0 refills | Status: DC
Start: 2018-10-12 — End: 2018-10-12

## 2018-10-12 MED ORDER — PANTOPRAZOLE 40 MG PO TBEC
40 mg | Freq: Every day | ORAL | 0 refills | Status: DC
Start: 2018-10-12 — End: 2018-10-18
  Administered 2018-10-12 – 2018-10-18 (×7): 40 mg via ORAL

## 2018-10-12 MED ORDER — ENOXAPARIN 40 MG/0.4 ML SC SYRG
40 mg | Freq: Every day | SUBCUTANEOUS | 0 refills | Status: CP
Start: 2018-10-12 — End: ?
  Administered 2018-10-12 – 2018-10-17 (×6): 40 mg via SUBCUTANEOUS

## 2018-10-12 MED ORDER — OXYCODONE 5 MG PO TAB
5 mg | ORAL | 0 refills | Status: DC | PRN
Start: 2018-10-12 — End: 2018-10-18
  Administered 2018-10-12 – 2018-10-18 (×31): 5 mg via ORAL

## 2018-10-12 MED ORDER — CHOLECALCIFEROL (VITAMIN D3) 25 MCG (1,000 UNIT) PO TAB
1000 [IU] | Freq: Every day | ORAL | 0 refills | Status: DC
Start: 2018-10-12 — End: 2018-10-18
  Administered 2018-10-12 – 2018-10-18 (×7): 1000 [IU] via ORAL

## 2018-10-12 MED ORDER — LOSARTAN 50 MG PO TAB
50 mg | Freq: Two times a day (BID) | ORAL | 0 refills | Status: DC
Start: 2018-10-12 — End: 2018-10-18
  Administered 2018-10-12 – 2018-10-18 (×14): 50 mg via ORAL

## 2018-10-12 MED ORDER — TEMAZEPAM 15 MG PO CAP
15 mg | Freq: Every evening | ORAL | 0 refills | Status: DC | PRN
Start: 2018-10-12 — End: 2018-10-18
  Administered 2018-10-12 – 2018-10-18 (×5): 15 mg via ORAL

## 2018-10-12 MED ORDER — FENTANYL CITRATE (PF) 50 MCG/ML IJ SOLN
25-50 ug | INTRAVENOUS | 0 refills | Status: DC | PRN
Start: 2018-10-12 — End: 2018-10-18
  Administered 2018-10-12 – 2018-10-13 (×7): 25 ug via INTRAVENOUS
  Administered 2018-10-14: 11:00:00 50 ug via INTRAVENOUS
  Administered 2018-10-14 – 2018-10-18 (×8): 25 ug via INTRAVENOUS

## 2018-10-12 MED ORDER — ATENOLOL 25 MG PO TAB
12.5 mg | Freq: Every evening | ORAL | 0 refills | Status: DC
Start: 2018-10-12 — End: 2018-10-18
  Administered 2018-10-12 – 2018-10-18 (×6): 12.5 mg via ORAL

## 2018-10-12 MED ORDER — MAGNESIUM SULFATE IN WATER 4 GRAM/50 ML (8 %) IV PGBK
4 g | Freq: Once | INTRAVENOUS | 0 refills | Status: CP
Start: 2018-10-12 — End: ?
  Administered 2018-10-12: 10:00:00 4 g via INTRAVENOUS

## 2018-10-12 MED ORDER — ACETAMINOPHEN 500 MG PO TAB
1000 mg | Freq: Three times a day (TID) | ORAL | 0 refills | Status: DC
Start: 2018-10-12 — End: 2018-10-18
  Administered 2018-10-12 – 2018-10-18 (×20): 1000 mg via ORAL

## 2018-10-12 MED ORDER — ASPIRIN 81 MG PO TBEC
81 mg | Freq: Every day | ORAL | 0 refills | Status: DC
Start: 2018-10-12 — End: 2018-10-18
  Administered 2018-10-12 – 2018-10-18 (×7): 81 mg via ORAL

## 2018-10-12 MED ORDER — OXYCODONE 5 MG PO TAB
5 mg | Freq: Once | ORAL | 0 refills | Status: CP
Start: 2018-10-12 — End: ?
  Administered 2018-10-12: 13:00:00 5 mg via ORAL

## 2018-10-12 NOTE — Care Coordination-Inpatient
Patient will go to Med-3 team after 8 AM, before that please page med teaching at [phone] for questions.

## 2018-10-12 NOTE — Progress Notes
84 yr old female  ER with anterior sub sternal chest pain  Intermittent-resolves with rest  EKG- SR  Troponin negative x2  Hyponatremic 129  CXR normal    PMH CAD, HTN, HLD, Afib, chronic back pain

## 2018-10-12 NOTE — Progress Notes
23:45- Pt arrived to unit via EMS. Ambulated with Carmen Jones assist because of right leg pain, she said she is not experiencing any chest pain currently. RN paged MOD team to assess and for pain medicine for right leg.     SR on tele. EKG obtained. A&Ox4  94% on RA  Shooting/ sharp pain c/o in right leg intermittently  No skin issues.     Call light in reach. Will continue to monitor

## 2018-10-12 NOTE — H&P (View-Only)
Admission History and Physical Examination      Name:  Carmen Jones                                             MRN:  [id number]   Admission Date:  10/11/2018                     Assessment/Plan:    Principal Problem:    Chest pain  Active Problems:    Essential hypertension    GERD (gastroesophageal reflux disease)    Arteriosclerotic coronary artery disease    Mixed hyperlipidemia    Hyponatremia    Hypomagnesemia    84yo female with a PMHx of GERD, HLD, CAD s/p PCI in 2013,and HTN who presents from [place] with worsening right sided sciatica pain in lower extremity and concern for unstable angina. CP had resolved prior to admission to [place]     Chest pain, likely 2/2 to Reflux (resolved)  - Patient had reported chest pain at OSH which prompted transfer to  as they were concerned for unstable angina  - EKG and troponins at OSH without sings of ischemia. Trops negative x2   - CXR at OSH without any acute abnormalities   - Pain was relieved with belching, has history of reflux. Worse with exertion but not necessarily relieved by rest  - Most recent stress test 09/2016 normal with normal   Plan  > No pain present at time of evaluation. Repeat EKG with NSR. No ST changes or TWI  > Troponin negative on admission. 2x negative at OSH. Low suscipion for ACS. Will stop trending unless chest pain reoccurs   > Continue PTA PPI   > Monitor on tele     RLE Hip/Sciatica Pain   - Chronic injury 25 years ago, acute worsening 2.5 weeks ago (Aug 6)  - Worse with activity/walking. Positive straight leg raise   - Was supposed to be seen by ortho on 8/24, but presented to the ER and was unable to make appointment   - Xrays at OSH reported by nurse at OSH and patient were negative for acute pathology  Plan  > pain control with lidocaine patch, scheduled tylenol. Norco helped at OSH. Will give oxy 5mg  to control pain. Bowel regimen while on opioids > May be helpful for pain anesthesia to see for possible injection  > Consider MRI in the AM. No red flag symptoms on presentation (urinary/fecal incontinence, saddle anesthesia, motor dysfunction) so will hold off for now    HTN  > Continue PTA HCTZ, Losartan, atenolol  > PTA felodipine held due to not being on formulary (amlodipine on formulary but patient didn't tolerate in past. Can't remember why. Will hold for now but patient okay with amlodipine if needed for HTN)     CAD s/p PCI to LAD in 2013  - Continue PTA ASA and pravastatin    Hypomagnesemia  Hyponatremia due to unknown reason  > Replaced Mg on admission  > Serum osms in AM and determine whether further work up needed for cause of hyponatremia. Appears euvolumeic on exam     Leukocytosis  - No evidence of infection.   - UA at OSH without evidcne of infection. CXR without PNA   > Will continue to monitor for symptoms off abx     FEN: regular diet, no IVF  DVT PPx: lovenox   Code Status: DNR-FI    Dispo: Admit to medicine. PT/OT consulted     Patient to be staffed in AM    Jennell Corner, M.D.   Internal Medicine PGY-3  Available on Voalte, pgr [phone]          __________________________________________________________________________________      Chief Complaint:  Right leg and hip pain     History of Present Illness: Carmen Jones is a 84 y.o. female  With a PMHx of GERD, HLD, CAD s/p PCI in 2013,and HTN who presents from [place] with worsening right sided sciatica pain in lower extremity and concern for unstable angina.     Patient presented to the [facility] ER on 8/24 due to worsening lower back and right hip pain.  She has chronic lower back pain from an injury 25 years ago, but had acute worsening of right hip pain radiating down her right posterior leg starting August 6.  There was no further trauma and was just worse one morning when she woke up.  The pain is aggravated with walking and has acutely worsened in the last 3 days.  She had been using Aleve at home for pain control with minimal improvement. She was supposed to have an appointment with orthopedics for right-sided pain on 8/24, however this was canceled after she presented to the ER as she did not know she couldn't be seen in the ER and at an outpatient appointment in the same day.  She had also mentioned chest discomfort while in the ER and they were concerned for unstable angina given her history of CAD with PCI.  For this reason she was transferred to [place] as her cardiologist is here.  EKG and troponins were without any ischemic changes at outside hospital.  Upon admission to [place] her chest pain had resolved after belching.         Medical History:   Diagnosis Date   ??? GERD (gastroesophageal reflux disease)    ??? HTN (hypertension)      No past surgical history on file.     Family history reviewed; non-contributory    Social History: Denies alcohol, tobacco, or drug use.     Immunizations (includes history and patient reported):   There is no immunization history on file for this patient.        Allergies:  Amlodipine and Lisinopril    Medications:  Current Facility-Administered Medications   Medication   ??? acetaminophen (TYLENOL) tablet 1,000 mg   ??? aspirin EC tablet 81 mg   ??? atenoloL (TENORMIN) tablet 12.5 mg   ??? cholecalciferol (VITAMIN D-3) tablet 1,000 Units   ??? docusate (COLACE) capsule 200 mg   ??? enoxaparin (LOVENOX) syringe 40 mg   ??? fentaNYL citrate PF (SUBLIMAZE) injection 25-50 mcg   ??? hydroCHLOROthiazide (HYDRODIURIL) tablet 25 mg   ??? lidocaine (LIDODERM) 5 % topical patch 1 patch    And   ??? Verification of Patch Placement and Integrity - Lidocaine 5%   ??? losartan (COZAAR) tablet 50 mg   ??? magnesium oxide (MAG-OX) tablet 400 mg   ??? magnesium sulfate   4 g/50 mL IVPB   ??? oxyCODONE (ROXICODONE) tablet 5 mg   ??? oxyCODONE (ROXICODONE) tablet 5 mg   ??? pantoprazole DR (PROTONIX) tablet 40 mg ??? pravastatin (PRAVACHOL) tablet 40 mg   ??? temazepam (RESTORIL) capsule 15 mg     Review of Systems:  A 14 point review of systems was negative except for: right  hip pain, RLE posterior pain, Right calf cramping        Physical Exam:  Vital Signs: Last Filed In 24 Hours Vital Signs: 24 Hour Range   BP: 137/72 (08/25 0355)  Temp: 36.6 ???C (97.8 ???F) (08/25 0355)  Pulse: 65 (08/25 0355)  Respirations: 18 PER MINUTE (08/24 2351)  SpO2: 95 % (08/25 0355) BP: (137-174)/(72-79)   Temp:  [36.5 ???C (97.7 ???F)-36.6 ???C (97.8 ???F)]   Pulse:  [65]   Respirations:  [18 PER MINUTE]   SpO2:  [94 %-95 %]    Intensity Pain Scale (Self Report): 8 (10/12/18 0355)      General:  Alert, cooperative, no distress, appears stated age  Head:  Normocephalic, without obvious abnormality, atraumatic  Eyes:  Conjunctivae/corneas clear.  EOMs intact.  Throat: Lips, mucosa and tongue normal.   Neck:    Supple, symmetrical  Lungs:  Clear to auscultation bilaterally  Heart:  Regular rate and rhythm, no murmur heard, pulses palpable  Abdomen:  Soft, non-tender, non-distended  Bowel sounds normal.    Extremities: Extremities normal, atraumatic, no cyanosis or edema. Equal strength in LE bilaterally. Sensation intact. Positive straight leg test on right   Neuro: CNII-XII grossly normal; Sensation: intact sensation to light touch of BLE  Psych: Appropriate mood and affect       Lab/Radiology/Other Diagnostic Tests:  24-hour labs:    Results for orders placed or performed during the hospital encounter of 10/11/18 (from the past 24 hour(s))   CBC AND DIFF    Collection Time: 10/12/18  1:05 AM   Result Value Ref Range    White Blood Cells 12.3 (H) 4.5 - 11.0 K/UL    RBC 4.54 4.0 - 5.0 M/UL    Hemoglobin 13.5 12.0 - 15.0 GM/DL    Hematocrit 16.1 36 - 45 %    MCV 87.9 80 - 100 FL    MCH 29.8 26 - 34 PG    MCHC 34.0 32.0 - 36.0 G/DL    RDW 09.6 11 - 15 %    Platelet Count 320 150 - 400 K/UL    MPV 7.5 7 - 11 FL    Neutrophils 84 (H) 41 - 77 % Lymphocytes 9 (L) 24 - 44 %    Monocytes 7 4 - 12 %    Eosinophils 0 0 - 5 %    Basophils 0 0 - 2 %    Absolute Neutrophil Count 10.35 (H) 1.8 - 7.0 K/UL    Absolute Lymph Count 1.07 1.0 - 4.8 K/UL    Absolute Monocyte Count 0.81 (H) 0 - 0.80 K/UL    Absolute Eosinophil Count 0.05 0 - 0.45 K/UL    Absolute Basophil Count 0.05 0 - 0.20 K/UL   COMPREHENSIVE METABOLIC PANEL    Collection Time: 10/12/18  1:05 AM   Result Value Ref Range    Sodium 127 (L) 137 - 147 MMOL/L    Potassium 4.0 3.5 - 5.1 MMOL/L    Chloride 92 (L) 98 - 110 MMOL/L    Glucose 127 (H) 70 - 100 MG/DL    Blood Urea Nitrogen 15 7 - 25 MG/DL    Creatinine 0.45 0.4 - 1.00 MG/DL    Calcium 9.5 8.5 - 40.9 MG/DL    Total Protein 6.7 6.0 - 8.0 G/DL    Total Bilirubin 0.8 0.3 - 1.2 MG/DL    Albumin 4.1 3.5 - 5.0 G/DL    Alk Phosphatase 65 25 - 110 U/L  AST (SGOT) 14 7 - 40 U/L    CO2 26 21 - 30 MMOL/L    ALT (SGPT) 11 7 - 56 U/L    Anion Gap 9 3 - 12    eGFR Non African American >60 >60 mL/min    eGFR African American >60 >60 mL/min   MAGNESIUM    Collection Time: 10/12/18  1:05 AM   Result Value Ref Range    Magnesium 1.5 (L) 1.6 - 2.6 mg/dL   PHOSPHORUS    Collection Time: 10/12/18  1:05 AM   Result Value Ref Range    Phosphorus 4.1 2.0 - 4.5 MG/DL   TROPONIN-I    Collection Time: 10/12/18  1:05 AM   Result Value Ref Range    Troponin-I 0.01 0.0 - 0.05 NG/ML     Glucose: (!) 127 (10/12/18 0105)  No orders to display         Jennell Corner, MD  Pager [phone]

## 2018-10-12 NOTE — Progress Notes
Assumed pt care at 0700.  VSS per trend, A&Ox4, tolerating RA, SR/SB c 1AVB on tele.  C/o pain despite current regimen. Requiring several doses of PRN Fentanyl. Team notified.  Denies n/v, SOA.  UOP adequate. - BM during shift, + bowel sounds. Last BM PTA.  High fall risk bundle in place, call light within reach, will cont to monitor.

## 2018-10-12 NOTE — Progress Notes
Patient arrived to room # [id number]) via cart accompanied by transport. Patient transferred to the bed with assistance. Bedside safety checks completed. Initial patient assessment completed. Refer to flowsheet for details.    Admission skin assessment completed with: Ezekiel Slocumb, RN      Pressure injury present on arrival?: No    1. Head/Face/Neck: No  2. Trunk/Back: No  3. Upper Extremities: No  4. Lower Extremities: No  5. Pelvic/Coccyx: No  6. Assessed for device associated injury? Yes  7. Malnutrition Screening Tool (Nursing Nutrition Assessment) Completed? No    See Doc Flowsheet for additional wound details.     INTERVENTIONS:

## 2018-10-13 ENCOUNTER — Encounter: Admit: 2018-10-13 | Discharge: 2018-10-13 | Primary: Family

## 2018-10-13 DIAGNOSIS — R079 Chest pain, unspecified: Principal | ICD-10-CM

## 2018-10-13 LAB — CBC AND DIFF: Lab: 8.7 K/UL — ABNORMAL LOW (ref 4.5–11.0)

## 2018-10-13 LAB — COMPREHENSIVE METABOLIC PANEL: Lab: 128 MMOL/L — ABNORMAL LOW (ref 137–147)

## 2018-10-13 MED ORDER — LORAZEPAM 0.5 MG PO TAB
.5 mg | Freq: Once | ORAL | 0 refills | Status: AC
Start: 2018-10-13 — End: ?

## 2018-10-13 MED ORDER — CALCIUM CARBONATE 200 MG CALCIUM (500 MG) PO CHEW
500 mg | Freq: Once | ORAL | 0 refills | Status: CP
Start: 2018-10-13 — End: ?
  Administered 2018-10-14: 03:00:00 500 mg via ORAL

## 2018-10-13 MED ORDER — DIAZEPAM 2 MG PO TAB
2 mg | Freq: Once | ORAL | 0 refills | Status: CP
Start: 2018-10-13 — End: ?
  Administered 2018-10-14: 03:00:00 2 mg via ORAL

## 2018-10-13 NOTE — Progress Notes
PHYSICAL THERAPY  ASSESSMENT      Name: Carmen Jones        MRN: [id number]          DOB: 1934-03-07          Age: 84 y.o.  Admission Date: 10/11/2018             LOS: 1 day      Mobility  Progressive Mobility Level: Walk in room  Distance Walked (feet): 35 ft  Level of Assistance: Assist X1  Assistive Device: Walker    Subjective  Significant hospital events: PMH: CAD s/p PCI in 2013 who presents from [place] with worsening right sided sciatica pain in lower extremity and concern for unstable angina. CP had resolved, Troponin negative x3    Mental / Cognitive Status: Alert;Oriented;Cooperative  Pain: Patient complains of pain;Before activity;During activity;After activity;8/10  Pain Location: Right;Buttocks(traveling down to mid calf)  Pain Description: Lambert Mody;Shooting  Pain Interventions: Patient agrees to participate in therapy with modifications to session    Ambulation Assist: Independent Mobility in Community without Device  Patient Owned Equipment: Clinical cytogeneticist started to use the past two days due to severe pain)  Type of Home: Regulatory affairs officer)  Entry Stairs: No Stairs  In-Home Stairs: Elevator(available, but prior was doing 3 flights of stairs daily to )    The patient notes pain started on Aug 6 and has been progressively getting worse over the past two weeks - no mechanism of injury noting she woke up and it was hurting. She was uncomfortable but was still able to be mobile - it did limit her participation in activities. However the most severe pain she has experienced started 2-3 days ago which was cause for ED visit. At the [facility], she is required to walk to meals as well as a communal bathroom and chapel.     The patient with call light on initially so assisted to bathroom.    Bed Mobility/Transfer  Bed Mobility: Supine to Sit: Standby Assist  Bed Mobility: Sit to Supine: Standby Assist    Transfer Type: Sit to Stand  Transfer: Assistance Level: To/From;Bed Risk analyst Transfer: Assistive Device: Nurse, adult    Other Transfer Type: Sit to Stand  Other Transfer: Assistance Level: To/From;Toilet Contact Guard Assist/Minimal Assist    Standing at the sink the patient leaning on elbows and raising and lowering R LE to assist with relief of pain.     Gait  Gait Distance: 35 feet  Gait: Assistance Level: Minimal Assist  Gait: Assistive Device: Roller Walker  Gait: Descriptors: Pace: Slow;Antalgic    Attempted to sit in bedside chair, but unable to find comfortable position even with legs elevated so decision made to return to bed.    The patient able to lay flat and perform leg lifts without increase in pain. She is unable to get into position to stretch piriformis at this time secondary to pain. Was able to perform knee to chest with addition of adduction and overpressure without much discomfort.    Education  Persons Educated: Patient  Patient Barriers To Learning: None Noted  Interventions: Repetition of Instructions  Teaching Methods: Verbal Instruction  Patient Response: Verbalized Understanding  Topics: Plan/Goals of PT Interventions;Use of Assistive Device/Orthosis;Up with Assist Only;Safety Awareness    Assessment/Progress  Assessment/Progress: The patient is typically very active and independent but radiating, shooting pain from buttocks has been severely limiting her mobility and comfort the past 2-3 days. At [facility] she is required to be mobile as  she has to walk to communal bathroom and dining space. She is restless during assessment and constantly changing positions in attempt to relieve pain but is unable to find position of comfort (sitting, standing, laying supine, laying sidelying). She has not found relief since being in acute stay and appears frustrated with lack of progression and plan of care.  Discussed discharge planning and patient in agreement that at current pain level and mobility, it would be difficult to [facility] to [facility] as she needs to walk long distances and be independent and may require additional support at skilled nursing. However, if pain improves anticipate independence and mobility also likely to improve and could discharge with outpatient PT.     AM-PAC 6 Clicks Basic Mobility Inpatient  Turning from your back to your side while in a flat bed without using bed rails: None  Moving from lying on your back to sitting on the side of a flatbed without using bedrails : None  Moving to and from a bed to a chair (including a wheelchair): A Little  Standing up from a chair using your arms (e.g. wheelchair, or bedside chair): A Little  To walk in hospital room: A Little  Climbing 3-5 steps with a railing: A Lot  Raw Score: 19  Standardized (T-scale) Score: 42.48  Basic Mobility CMS 0-100%: 36.99  CMS G Code Modifier for Basic Mobility: CJ    Goals  Patient Will Go Supine To/From Sit: Independently  Patient Will Transfer Sit to Stand: Independently  Patient Will Ambulate: Greater than 200 Feet, Independently    Plan  Plan Frequency: 5 Days per Week  Plan of care: increase independence with mobility, increase ambulation distances     PT Discharge Recommendations  Recommendation: Currently patient requires inpatient level of care. However, typical progression for patient condition would anticipate home with assistance at time of discharge. Currently limited by pain.     Therapist  Elvina Sidle, PT, DPT  Date  10/13/2018

## 2018-10-13 NOTE — Progress Notes
Assessments complete and documented per flowsheet.   A/Ox4. VSS. Tolerating RA. SR/SB c 1AVB on tele.   C/o pain in right hip that radiates down leg to foot. Pt states that PRN Oxy, Tylenol, and Fentanyl give her some relief for short periods, but she does not feel like she is improving.  Pt states that she would like to be seen by ortho.  Ambulating in room with assist x1 and a walker, fall bundle in place.   UOA, last BM PTA.   Call light within reach, will continue to monitor until transferring care to day shift RN.

## 2018-10-13 NOTE — Progress Notes
Assumed pt care at 0700.  VSS per trend, A&Ox4, tolerating RA, SR/SB c 1AVB on tele.  Still having leg pain, but more controlled today vs. yesterday.  Denies n/v, SOA.  UOP adequate. - BM during shift, + bowel sounds. Last BM PTA.  High fall risk bundle in place, call light within reach, will cont to monitor.    1600-SBP elevated in upper 150s and low 160s. Team notified.

## 2018-10-13 NOTE — Progress Notes
OCCUPATIONAL THERAPY  ASSESSMENT NOTE      Name: Carmen Jones        MRN: [id number]          DOB: 27-May-1934          Age: 84 y.o.  Admission Date: 10/11/2018             LOS: 1 day      Mobility  Patient Turn/Position: Weight shifted (Bed)  Progressive Mobility Level: Walk in room  Distance Walked (feet): 70 ft(30+40)  Level of Assistance: Assist X1  Assistive Device: Walker  Time Tolerated: 11-30 minutes  Activity Limited By: Pain    Subjective  Pertinent Dx per Physician: h/o GERD, HLD, CAD s/p PCI in 2013, and HTN who presents as a transfer from [place] with worsening right sided sciatica pain in lower extremity and concern for unstable angina.   Precautions: Falls  Pain / Complaints: Patient agrees to participate in therapy  Pain Location: Right;Hip;Thigh;Leg  Pain Level Current: (Pt does not rate.)  Comments: Heating pad applied after session to aid in pain management.    Objective  Psychosocial Status: Willing and Cooperative to Participate    Home Living  Type of Home: (convent)  Home Layout: Performs ADL'S on One Level;Elevator(completes 3 flights of steps to room but elevator available)  Financial risk analyst / Tub: Pension scheme manager: Standard  Bathroom Equipment: Writer in [employer] Around Technical sales engineer Accessibility: Accessible via [employer] Equipment: Dan Humphreys  Comment: Pt has access to necessary DME at [employer] - was borrowing walker for a few days before admission.    Prior Function  Level Of Independence: Independent with ADLs and functional transfers  Receives Help From: None Needed  Other Function Comments: Pt reports she is typically very independent up until the last few days where walking became difficult. She has to walk approximately 100 feet to access communal bathroom and dining area.    Vision  Current Vision: Wears Glasses All of the Time  Comment: Pt denies acute visual changes.    ADL's  Where Assessed: Edge of Bed;In Bathroom;Standing at [employer] Eating Assist: Independent  Eating Deficits: No Assist Needed;Beverage Management  Grooming Assist: Stand By Assist  Grooming Deficits: Supervision/Safety;Wash/Dry Hands  LE Dressing Assist: Stand By Assist  LE Dressing Deficits: Supervision/Safety;Don/Doff R Sock;Don/Doff L Sock(crossover technique seated edge of bed)  Toileting Assist: Stand By Assist  Toileting Deficits: Perineal Hygiene    ADL Mobility  Bed Mobility: Supine to Sit: Standby assist  Bed Mobility: Sit to Supine: Standby assist  Bed Mobility Comments: bed flat  Transfer Type: Sit to/from stand  Transfer: Assistance Level: To/from;Bed;Toilet;Standby assist  Transfer: Assistive Device: Agricultural consultant: Type of Assistance: For safety considerations  End of Activity Status: In bed;Instructed patient to request assist with mobility;Instructed patient to use call light;Nursing notified  Gait Distance: 70 feet  Gait: Assistance Level: Minimal assist(contact guard assist)  Gait: Assistive Device: Roller walker  Gait Comments: Pt able to complete ADLs and ambulate approximately 50 feet before RLE pain began to increase - pt requests to stop activity once LE became painful again.    Activity Tolerance  Endurance: 2/5 Tolerates 10-20 Minutes Exercise w/Multiple Rests    Cognition  Overall Cognitive Status: WFL to Adequately Complete Self Care Tasks Safely  Attention: Awake/Alert    UE AROM  Coordination: Adequate to Complete ADLs  Grasp: Bilateral Grasp Functional for Activity  Comment: BUE AROM [facility]  Sensory  Overall Sensory: Pt Perceives Pressure in Both UEs in Gross Exam  Comment: Pt denies numbness/tingling in BUE.    UE Strength / Tone  Overall Strength / Tone: WFL Able to Perform ADL Tasks    Education  Persons Educated: Patient  Barriers To Learning: None Noted  Teaching Methods: Verbal Instruction;Demonstration  Patient Response: Verbalized and Demo Understanding  Topics: Role of OT, Goals for Therapy;ADL Compensatory Techniques Goal Formulation: With Patient    Assessment  Assessment: Decreased ADL Status;Decreased Endurance;Decreased Self-Care Trans;Decreased High-Level ADLs  Prognosis: Good  Goal Formulation: Patient  Comments: Patient primarily limited by pain. Overall, patient reports pain is improving but tends to wax and wane during the day. Patient was able to complete ADLs with stand by assist this date but still limited in activity 2/2 pain. Anticipate if pain is better controlled, patient would be safe to discharge to [facility] as she would have to be able to ambulate to communal bathroom and dining area. If pain does not improve consistently, patient may benefit from discharging to inpatient setting to progress ADLs and mobility prior to going back home.     AM-PAC 6 Clicks Daily Activity Inpatient  Putting on and taking off regular lower body clothes?: A Little  Bathing (Including washing, rinsing, drying): A Little  Toileting, which includes using toilet, bedpan, or urinal: A Little  Putting on and taking off regular upper body clothing: None  Taking care of personal grooming such as brushing teeth: None  Eating meals?: None  Daily Activity Raw Score: 21  Standardized (t-scale) score: 44.27  CMS 0-100% Score: 32.79  CMS G Code Modifier: CJ    Plan  OT Frequency: 5x/week  OT Plan for Next Visit: toileting, grooming standing at sink, attempt to don pants, progress activity as pain allows    ADL Goals  Patient Will Perform All ADL's: w/ Stand By Assist    Functional Transfer Goals  Pt Will Perform All Functional Transfers: w/ Stand By Assist    OT Discharge Recommendations  Recommendation: Currently patient requires inpatient level of care. However, typical progression for patient condition would anticipate home with assistance at time of discharge.    Therapist: Mariana Single, OTR/L [phone]  Date: 10/13/2018

## 2018-10-13 NOTE — Progress Notes
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     #Chest pain, likely 2/2 reflux - resolved  - Pt reported chest pain at OSH which prompted transfer to [place] 2/2 concern for unstable angina  - EKG at OSH without signs of ischemia. Troponins negative x2  - Pain resolved with belching prior to admission to [facility] [place]; hx of GERD  - Repeat EKG at [place] with NSR, no ST changes. Troponin negative  > Continue PTA PPI  > CTM    #RLE Hip/Sciatica Pain  - Chronic low back pain from injury 25 years ago; right hip pain worsened starting 8/6 with acute worsening the last few days  - Xrays at OSH per nurse at OSH and pt were negative for acute pathology  - L-spine 8/25: grade 1 spondylolisthesis at L4-5 and L5-S1 levels, mild retrolisthesis of L2 on L3, disc space narrowing present at L2-3 level  > Pain control with lidocaine patch, oxycodone, fentanyl, scheduled Tylenol  > Obtain MRI L-spine w/o contrast  > Consult neurosurgery  > CTM for alarm symptoms (urinary/fecal incontinence, saddle anesthesia, motor dysfunction)    #HTN  - PTA felodipine held 2/2 not being on formulary  - HCTZ held in setting of hyponatremia  > Continue Losartan, Atenolol    #Hypomagnesemia, Hyponatremia  - Mg sulfate 4g given 8/25  - Na 127, serum osmolality 267 on admission  - 8/26: Na 128   > Mag-ox 400mg  BID   > CTM      FEN: Cardiac diet, no IVF  PPx: Lovenox  Code Status: DNAR-FI ________________________________________________________________________    Subjective  Carmen Jones is a 84 y.o. female. No acute events overnight. This morning she is pleasant and cooperative although in obvious discomfort, complaining of worsening hip pain that is only temporarily relieved by her current pain regimen. She reports that there is some tenderness to palpation along her posterior right leg, especially along her posterior thigh. She denies any numbness, tingling, problems with incontinence, or motor dysfunction. She additionally denies any chest pain, shortness of breath, abdominal pain.     Medications  Scheduled Meds:acetaminophen (TYLENOL) tablet 1,000 mg, 1,000 mg, Oral, TID  aspirin EC tablet 81 mg, 81 mg, Oral, QDAY  atenoloL (TENORMIN) tablet 12.5 mg, 12.5 mg, Oral, QHS  cholecalciferol (VITAMIN D-3) tablet 1,000 Units, 1,000 Units, Oral, QDAY  docusate (COLACE) capsule 200 mg, 200 mg, Oral, QAM8  enoxaparin (LOVENOX) syringe 40 mg, 40 mg, Subcutaneous, QDAY(21)  lidocaine (LIDODERM) 5 % topical patch 1 patch, 1 patch, Topical, QDAY    And  Verification of Patch Placement and Integrity - Lidocaine 5%, , Transdermal, BID  LORazepam (ATIVAN) tablet 0.5 mg, 0.5 mg, Oral, ONCE  losartan (COZAAR) tablet 50 mg, 50 mg, Oral, BID  magnesium oxide (  MAG-OX) tablet 400 mg, 400 mg, Oral, BID  pantoprazole DR (PROTONIX) tablet 40 mg, 40 mg, Oral, QDAY(21)  pravastatin (PRAVACHOL) tablet 40 mg, 40 mg, Oral, QDAY    Continuous Infusions:  PRN and Respiratory Meds:fentaNYL citrate PF Q2H PRN, oxyCODONE Q4H PRN, temazepam QHS PRN      Review of Systems:  A 14 point review of systems was negative except for:  Musculoskeletal: positive for R hip pain, RLE posterior pain, R calf pain, R calf cramping    Objective:                          Vital Signs: Last Filed                 Vital Signs: 24 Hour Range   BP: 147/60 (08/26 0800)  Temp: 36.7 ???C (98 ???F) (08/26 0800)  Pulse: 59 (08/26 0800) Respirations: 16 PER MINUTE (08/26 0800)  SpO2: 92 % (08/26 0800) BP: (131-154)/(60-76)   Temp:  [36.3 ???C (97.4 ???F)-36.7 ???C (98.1 ???F)]   Pulse:  [55-62]   Respirations:  [16 PER MINUTE-18 PER MINUTE]   SpO2:  [92 %-97 %]    Intensity Pain Scale (Self Report): 7 (10/13/18 0815) Vitals:    10/11/18 2300 10/12/18 1000   Weight: 81.6 kg (180 lb) 81.6 kg (179 lb 14.3 oz)       Intake/Output Summary:  (Last 24 hours)    Intake/Output Summary (Last 24 hours) at 10/13/2018 1237  Last data filed at 10/13/2018 1100  Gross per 24 hour   Intake 930 ml   Output 2600 ml   Net -1670 ml           Physical Exam  General: Alert, cooperative woman in mild distress, appears stated age, AOx4  HEENT: Normocephalic, atraumatic, EOMs intact  Pulmonary: Clear to auscultation bilaterally  Cardiovascular: RRR, no murmurs, pulses palpable  Abdomen: Soft, non-tender, non-distended  Extremities: No cyanosis or edema, equal strength in LE bilaterally.   Neuro: CNII-XII grossly normal. Sensation intact to touch of BLE, patellar reflex normal    Lab Review  Comprehensive Metabolic Profile  CMP Latest Ref Rng & Units 10/13/2018 10/12/2018 07/31/2017 12/15/2016 09/23/2016   NA 137 - 147 MMOL/L 128(L) 127(L) 133(L) 135(L) 131(L)   K 3.5 - 5.1 MMOL/L 4.1 4.0 4.8 4.4 4.2   CL 98 - 110 MMOL/L 91(L) 92(L) 98 100 94(L)   CO2 21 - 30 MMOL/L 29 26 26.0 26.0 26.0   GAP 3 - 12 8 9  - 13 15(H)   BUN 7 - 25 MG/DL 15 15 91.4 78.2 95.6   CR 0.4 - 1.00 MG/DL 2.13 0.86 5.78 4.69 6.29   GLUX 70 - 100 MG/DL 528(U) 132(G) 401(U) 272(Z) 102   CA 8.5 - 10.6 MG/DL 9.4 9.5 9.7 36.6 44.0   TP 6.0 - 8.0 G/DL 6.7 6.7 7.1 - -   ALB 3.5 - 5.0 G/DL 3.9 4.1 3.9 - -   ALKP 25 - 110 U/L 67 65 66 - -   ALT 7 - 56 U/L 9 11 13 14  -   TBILI 0.3 - 1.2 MG/DL 0.7 0.8 3.47 - -   GFR >60 mL/min >60 >60 - - 76.1   GFRAA >60 mL/min >60 >60 - - 87.7         Point of Care Testing  (Last 24 hours)  Glucose: (!) 107 (10/13/18 0410)    Radiology and other Diagnostics Review:  L Spine AP & Lateral Findings/impression:   1. A right convexity lumbar scoliosis measures 16 degrees from L1 to L4.   2. Vertebral body heights appear well-maintained.   3. Grade 1 spondylolisthesis is present at the L4-5 and L5-S1 levels.   There is mild retrolisthesis of L2 on L3. Disc space narrowing is present   at the L2-3 level.     Curlene Dolphin MS3      ATTESTATION    I personally performed or re-performed the history, physical exam and treatment plan for the E/M. I discussed the case with the Medical Student, and concur with the Medical Student documentation of history, physical exam and treatment plan unless otherwise noted.    Resident name:  Ellsworth Lennox, DO Date:  10/13/2018

## 2018-10-14 LAB — OSMOLALITY: Lab: 280 mosm/kg — ABNORMAL HIGH (ref 280–307)

## 2018-10-14 LAB — COMPREHENSIVE METABOLIC PANEL: Lab: 130 MMOL/L — ABNORMAL LOW (ref 137–147)

## 2018-10-14 LAB — SODIUM-URINE RANDOM: Lab: 17 MMOL/L

## 2018-10-14 LAB — CBC AND DIFF
Lab: 14 g/dL — ABNORMAL HIGH (ref 12.0–15.0)
Lab: 9.5 K/UL (ref 4.5–11.0)

## 2018-10-14 LAB — OSMOLALITY-URINE RANDOM: Lab: 119 mosm/kg (ref 50–1400)

## 2018-10-14 LAB — MAGNESIUM: Lab: 1.9 mg/dL — ABNORMAL LOW (ref >60)

## 2018-10-14 MED ORDER — POLYETHYLENE GLYCOL 3350 17 GRAM PO PWPK
1 | Freq: Two times a day (BID) | ORAL | 0 refills | Status: DC
Start: 2018-10-14 — End: 2018-10-18
  Administered 2018-10-14 – 2018-10-15 (×3): 17 g via ORAL

## 2018-10-14 MED ORDER — CALCIUM CARBONATE 200 MG CALCIUM (500 MG) PO CHEW
500 mg | Freq: Once | ORAL | 0 refills | Status: CP
Start: 2018-10-14 — End: ?
  Administered 2018-10-15: 04:00:00 500 mg via ORAL

## 2018-10-14 NOTE — Progress Notes
OCCUPATIONAL THERAPY  PROGRESS NOTE      Name: Carmen Jones        MRN: [id number]          DOB: 1934-10-10          Age: 84 y.o.  Admission Date: 10/11/2018             LOS: 2 days      Mobility  Patient Turn/Position: Self  Progressive Mobility Level: Walk in hallway  Distance Walked (feet): 100 ft  Level of Assistance: Assist X1  Assistive Device: Walker  Time Tolerated: 11-30 minutes  Activity Limited By: Pain    Subjective  Pertinent Dx per Physician: h/o GERD, HLD, CAD s/p PCI in 2013, and HTN who presents as a transfer from [place] with worsening right sided sciatica pain in lower extremity and concern for unstable angina.   Precautions: Falls  Pain / Complaints: Patient agrees to participate in therapy  Pain Location: Right;Hip;Thigh;Leg  Pain Level Current: (4/10 initially, 7/10 peak of activity, 4/10 at rest)  Comments: Heating pad applied after session to aid in pain management.    Objective  Psychosocial Status: Willing and Cooperative to Participate    Home Living  Type of Home: (convent)  Home Layout: Performs ADL'S on One Level;Elevator(completes 3 flights of steps to room but elevator available)  Financial risk analyst / Tub: Pension scheme manager: Standard  Bathroom Equipment: Writer in [employer] Around Technical sales engineer Accessibility: Accessible via [employer] Equipment: Dan Humphreys  Comment: Pt has access to necessary DME at [employer] - was borrowing walker for a few days before admission.    Prior Function  Level Of Independence: Independent with ADLs and functional transfers  Receives Help From: None Needed  Other Function Comments: Pt reports she is typically very independent up until the last few days where walking became difficult. She has to walk approximately 100 feet to access communal bathroom and dining area.    Vision  Current Vision: Wears Glasses All of the Time  Comment: Pt denies acute visual changes.    ADL's  Where Assessed: Standing at [employer] Assist: Stand By Assist  Grooming Deficits: Supervision/Safety;Teeth Care;Brushing Hair(static standing at sink x3 minutes)    ADL Mobility  Bed Mobility: Supine to Sit: Standby assist  Bed Mobility: Sit to Supine: Standby assist  Bed Mobility Comments: bed flat  Transfer Type: Sit to/from stand  Transfer: Assistance Level: To/from;Bed;Standby assist  Transfer: Assistive Device: Agricultural consultant: Type of Assistance: For safety considerations  End of Activity Status: In bed;Instructed patient to request assist with mobility;Instructed patient to use call light;Nursing notified  Gait Distance: 100 feet  Gait: Assistance Level: Standby assist  Gait: Assistive Device: Roller walker  Gait Comments: Pt able to complete standing ADL tasks and approximately 100 feet total before RLE began to increase - pt requests to stop activity once LE became painful.    Activity Tolerance  Endurance: 2/5 Tolerates 10-20 Minutes Exercise w/Multiple Rests    Cognition  Overall Cognitive Status: WFL to Adequately Complete Self Care Tasks Safely  Attention: Awake/Alert    Education  Goal Formulation: With Patient    Assessment  Assessment: Decreased ADL Status;Decreased Endurance;Decreased Self-Care Trans;Decreased High-Level ADLs  Prognosis: Good  Goal Formulation: Patient  Comments: Patient continues to be limited by pain. Overall, patient reports pain is improving but tends to wax and wane during the day. Anticipate if pain is better controlled, patient would be safe to  discharge to [facility] as she would have to be able to ambulate to communal bathroom and dining area. If pain does not improve consistently, patient may benefit from discharging to inpatient setting to progress ADLs and mobility prior to going back home.     AM-PAC 6 Clicks Daily Activity Inpatient  Putting on and taking off regular lower body clothes?: A Little  Bathing (Including washing, rinsing, drying): A Little Toileting, which includes using toilet, bedpan, or urinal: A Little  Putting on and taking off regular upper body clothing: None  Taking care of personal grooming such as brushing teeth: None  Eating meals?: None  Daily Activity Raw Score: 21  Standardized (t-scale) score: 44.27  CMS 0-100% Score: 32.79  CMS G Code Modifier: CJ    Plan  OT Frequency: 5x/week  OT Plan for Next Visit: don pants, continue to progress activity as pain allows    ADL Goals  Patient Will Perform All ADL's: w/ Stand By Assist    Functional Transfer Goals  Pt Will Perform All Functional Transfers: w/ Stand By Assist    OT Discharge Recommendations  Recommendation: Currently patient requires inpatient level of care. However, typical progression for patient condition would anticipate home with assistance at time of discharge.(pending improvement of pain)    Therapist: Mariana Single, OTR/L [phone]  Date: 10/14/2018

## 2018-10-14 NOTE — Progress Notes
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     #Chest pain, likely 2/2 reflux - resolved  - Pt reported chest pain at OSH which prompted transfer to [place] 2/2 concern for unstable angina  - EKG at OSH without signs of ischemia. Troponins negative x2  - Pain resolved with belching prior to admission to [place]; hx of GERD  - Repeat EKG at [place] with NSR, no ST changes. Troponin negative  > Continue PTA PPI  > CTM    #RLE Hip/Sciatica Pain  - Chronic low back pain from injury 25 years ago; right hip pain worsened starting 8/6 with acute worsening the last few days  - Xrays at OSH per nurse at OSH and pt were negative for acute pathology  - L-spine 8/25: grade 1 spondylolisthesis at L4-5 and L5-S1 levels, mild retrolisthesis of L2 on L3, disc space narrowing present at L2-3 level  - MRI L-spine 8/26: severe central spinal and right lateral recess stenosis and at least moderate bilateral foraminal stenosis, at least moderate left lateral recess stenosis at L1-L2 and L2-L3 and moderate degenerative trefoil type central spinal stenosis at L4-L5  > Pain control with lidocaine patch, oxycodone, fentanyl, scheduled Tylenol  > Ortho consulted, recs pending  > CTM for alarm symptoms (urinary/fecal incontinence, saddle anesthesia, motor dysfunction)    #HTN  - PTA felodipine held 2/2 not being on formulary; plan to resume on discharge - HCTZ held in setting of hyponatremia; plan to hold on discharge until PCP followup  > Continue Losartan, Atenolol  > CTM    #Hypomagnesemia, Hyponatremia  - Mg sulfate 4g given 8/25  - Na 127, serum osmolality 267 on admission  - 8/27: Na 130, serum osmolality 280  > Mag-ox 400mg  BID  > Encourage fluid intake   > CTM    #Constipation  -miralax BID, docusate      FEN: Cardiac diet, no IVF  PPx: Lovenox  Code Status: DNAR-FI  ________________________________________________________________________    Subjective  Carmen Jones is a 84 y.o. female. No acute events overnight. This morning she is pleasant and cooperative, stating that her pain is better than yesterday. She denies any change in the quality or location of pain. She continues to note some tenderness to palpation along her posterior right leg, especially along her posterior thigh. The patient additionally notes some stiffness in her toes but her sensation is intact. She denies any numbness, tingling, problems with incontinence, or motor dysfunction. She has not yet had a bowel movement although she has been passing gas. The patient denies any chest pain, shortness of breath, abdominal pain, nausea, vomiting.    Medications  Scheduled Meds:acetaminophen (TYLENOL) tablet 1,000 mg, 1,000 mg, Oral, TID  aspirin EC tablet 81 mg, 81  mg, Oral, QDAY  atenoloL (TENORMIN) tablet 12.5 mg, 12.5 mg, Oral, QHS  cholecalciferol (VITAMIN D-3) tablet 1,000 Units, 1,000 Units, Oral, QDAY  docusate (COLACE) capsule 200 mg, 200 mg, Oral, QAM8  enoxaparin (LOVENOX) syringe 40 mg, 40 mg, Subcutaneous, QDAY(21)  lidocaine (LIDODERM) 5 % topical patch 1 patch, 1 patch, Topical, QDAY    And  Verification of Patch Placement and Integrity - Lidocaine 5%, , Transdermal, BID  LORazepam (ATIVAN) tablet 0.5 mg, 0.5 mg, Oral, ONCE  losartan (COZAAR) tablet 50 mg, 50 mg, Oral, BID  magnesium oxide (MAG-OX) tablet 400 mg, 400 mg, Oral, BID pantoprazole DR (PROTONIX) tablet 40 mg, 40 mg, Oral, QDAY(21)  pravastatin (PRAVACHOL) tablet 40 mg, 40 mg, Oral, QDAY    Continuous Infusions:  PRN and Respiratory Meds:fentaNYL citrate PF Q2H PRN, oxyCODONE Q4H PRN, temazepam QHS PRN      Review of Systems:  A 14 point review of systems was negative except for:  Musculoskeletal: positive for R hip pain, RLE posterior pain, R calf pain, R calf cramping    Objective:                          Vital Signs: Last Filed                 Vital Signs: 24 Hour Range   BP: 158/62 (08/27 1100)  Temp: 36.7 ???C (98 ???F) (08/27 1100)  Pulse: 59 (08/27 1100)  Respirations: 16 PER MINUTE (08/27 1100)  SpO2: 94 % (08/27 1100) BP: (154-169)/(62-84)   Temp:  [36.3 ???C (97.3 ???F)-37.3 ???C (99.1 ???F)]   Pulse:  [59-84]   Respirations:  [16 PER MINUTE]   SpO2:  [93 %-97 %]    Intensity Pain Scale (Self Report): 7 (10/14/18 0825) Vitals:    10/11/18 2300 10/12/18 1000   Weight: 81.6 kg (180 lb) 81.6 kg (179 lb 14.3 oz)       Intake/Output Summary:  (Last 24 hours)    Intake/Output Summary (Last 24 hours) at 10/14/2018 1239  Last data filed at 10/14/2018 1914  Gross per 24 hour   Intake 2790 ml   Output 700 ml   Net 2090 ml           Physical Exam  General: Alert, cooperative woman in mild distress, appears stated age, AOx4  HEENT: Normocephalic, atraumatic, EOMs intact  Pulmonary: Clear to auscultation bilaterally  Cardiovascular: RRR, no murmurs, pulses palpable  Abdomen: Soft, non-tender, non-distended  Extremities: No cyanosis or edema, equal strength in LE bilaterally.   Neuro: CNII-XII grossly normal. Sensation intact to touch of BLE, patellar reflex normal    Lab Review  Comprehensive Metabolic Profile  CMP Latest Ref Rng & Units 10/14/2018 10/13/2018 10/12/2018 07/31/2017 12/15/2016   NA 137 - 147 MMOL/L 130(L) 128(L) 127(L) 133(L) 135(L)   K 3.5 - 5.1 MMOL/L 4.2 4.1 4.0 4.8 4.4   CL 98 - 110 MMOL/L 93(L) 91(L) 92(L) 98 100   CO2 21 - 30 MMOL/L 26 29 26  26.0 26.0   GAP 3 - 12 11 8 9  - 13 BUN 7 - 25 MG/DL 14 15 15  20.0 13.0   CR 0.4 - 1.00 MG/DL 7.82 9.56 2.13 0.86 5.78   GLUX 70 - 100 MG/DL 469(G) 295(M) 841(L) 244(W) 114(H)   CA 8.5 - 10.6 MG/DL 9.9 9.4 9.5 9.7 10.2   TP 6.0 - 8.0 G/DL 7.4 6.7 6.7 7.1 -   ALB 3.5 - 5.0 G/DL 4.4  3.9 4.1 3.9 -   ALKP 25 - 110 U/L 73 67 65 66 -   ALT 7 - 56 U/L 10 9 11 13 14    TBILI 0.3 - 1.2 MG/DL 0.5 0.7 0.8 1.61 -   GFR >60 mL/min >60 >60 >60 - -   GFRAA >60 mL/min >60 >60 >60 - -         Point of Care Testing  (Last 24 hours)  Glucose: (!) 131 (10/14/18 0443)    Radiology and other Diagnostics Review:    L Spine AP & Lateral 8/25  Findings/impression:   1. A right convexity lumbar scoliosis measures 16 degrees from L1 to L4.   2. Vertebral body heights appear well-maintained.   3. Grade 1 spondylolisthesis is present at the L4-5 and L5-S1 levels.   There is mild retrolisthesis of L2 on L3. Disc space narrowing is present   at the L2-3 level.     MRI L-Spine WO Contrast 8/26      1. ???At L5-S1: Grade 1 (presumably degenerative) anterolisthesis, marked   degenerative disc disease, marked facet hypertrophy and ligamentum flavum   thickening, and superimposed large right subarticular disc extrusion   (cephalad migrated to the pedicular L5 level) results in severe central   spinal and right lateral recess stenosis and at least moderate bilateral   foraminal stenosis.       2. ???At least moderate left lateral recess stenosis at L1-L2 and L2-L3 and   moderate degenerative trefoil type central spinal stenosis at L4-L5.   Additional levels of less severe degenerative stenosis (as detailed   above).       3. ???Mild right convexity lumbar curvature. Minimal anterolisthesis at   L4-L5 and slight retrolisthesis at L1-L2 and L2-L3.       4. ???Subtle lumbosacral transitional anatomy with first nonrib-bearing   vertebral body designated L1, slight lumbarization-squaring of the S1   vertebral body, and small S1-S2 vestigial disc.       Curlene Dolphin MS3     ATTESTATION I personally performed or re-performed the history, physical exam and treatment plan for the E/M. I discussed the case with the Medical Student, and concur with the Medical Student documentation of history, physical exam and treatment plan unless otherwise noted.    Resident name:  Ellsworth Lennox, DO Date:  10/14/2018

## 2018-10-14 NOTE — Progress Notes
PHYSICAL THERAPY  PROGRESS NOTE        Name: Carmen Jones        MRN: [id number]          DOB: Mar 10, 1934          Age: 84 y.o.  Admission Date: 10/11/2018             LOS: 2 days        Mobility  Patient Turn/Position: Self  Progressive Mobility Level: Walk in hallway(walked laps in room)  Distance Walked (feet): 80 ft  Level of Assistance: Stand by assistance  Assistive Device: Walker  Time Tolerated: 11-30 minutes  Activity Limited By: Pain    Subjective  Significant hospital events: PMH: CAD s/p PCI in 2013 who presents from [place] with worsening right sided sciatica pain in lower extremity and concern for unstable angina. CP had resolved, Troponin negative x3  Mental / Cognitive Status: Alert;Oriented;Cooperative  Pain: Patient complains of pain;Patient does not rate pain  Pain Location: Right;Leg  Pain Description: Aching  Pain Interventions: Patient agrees to participate in therapy with modifications to session;Patient pre-medicated  Comments: Room air.   Ambulation Assist: Independent Mobility in Community without Device  Patient Owned Equipment: Clinical cytogeneticist started to use the past two days due to severe pain )  [facility]  Entry Stairs: No Stairs  In-Home Stairs: Elevator(available, but prior was doing 3 flights of stairs daily)  Comments: The patient notes pain started on Aug 6 and has been progressively getting worse over the past two weeks. Most severe pain she has experienced - limiting her ability to participate in activities. She is required to walk to meals as well as a [facility] bathroom and [facility]. 8/27-Reports calling Sister Darral Dash who reminded the patient that they do have scooters at the [facility] she could potentially use.     Bed Mobility/Transfer  Transfer Type: Sit to/from Stand  Transfer: Assistance Level: To/From;Bed;Toilet;Standby Assist  Transfer: Assistive Device: Nurse, adult  Transfers: Type Of Assistance: Materials engineer;For Safety Considerations End Of Activity Status: Sitting at [facility] of Bed;Nursing Notified;Instructed Patient to Use Call Light;Instructed Patient to Request Assist with Mobility    Balance  Sitting Balance: Static Sitting Balance;Dynamic Sitting Balance;Independent  Standing Balance: Static Standing Balance;Dynamic Standing Balance;2 UE support;Standby Assist(CGA at times)    Gait  Gait Distance: 80 feet  Gait: Assistance Level: Standby Assist;Safety Considerations  Gait: Assistive Device: Technical brewer: Descriptors: Antalgic;Decreased stance time RLE;Pace: Slow;Step-To Gait;Variable step length    Assessment/Progress  Impaired Mobility Due To: Pain  Assessment/Progress: Should Improve w/ Continued PT  Comments: Able to progress distance ambulated this date however remains limited by pain. Will continue to benefit from ongoing PT.     AM-PAC 6 Clicks Basic Mobility Inpatient  Turning from your back to your side while in a flat bed without using bed rails: None  Moving from lying on your back to sitting on the side of a flatbed without using bedrails : None  Moving to and from a bed to a chair (including a wheelchair): A Little  Standing up from a chair using your arms (e.g. wheelchair, or bedside chair): A Little  To walk in hospital room: A Little  Climbing 3-5 steps with a railing: A Lot  Raw Score: 19  Standardized (T-scale) Score: 42.48  Basic Mobility CMS 0-100%: 36.99  CMS G Code Modifier for Basic Mobility: CJ    Goals  Goal Formulation: With Patient  Patient  Will Go Supine To/From Sit: Independently  Patient Will Transfer Sit to Stand: Independently  Patient Will Ambulate: Greater than 200 Feet, Independently    Plan  Treatment Interventions: Mobility Training;Strengthening  Plan Frequency: 5 Days per Week    PT Discharge Recommendations  Recommendation: Currently patient requires inpatient level of care. However, typical progression for patient condition would anticipate home with assistance at time of discharge. Therapist: Versie Starks, PT, DPT [phone]  Date: 10/14/2018

## 2018-10-15 LAB — CBC AND DIFF: Lab: 6.7 K/UL — ABNORMAL LOW (ref 4.5–11.0)

## 2018-10-15 LAB — COMPREHENSIVE METABOLIC PANEL: Lab: 130 MMOL/L — ABNORMAL LOW (ref 137–147)

## 2018-10-15 LAB — MAGNESIUM: Lab: 1.9 mg/dL — ABNORMAL LOW (ref 1.6–2.6)

## 2018-10-15 MED ORDER — CALCIUM CARBONATE 200 MG CALCIUM (500 MG) PO CHEW
500 mg | ORAL | 0 refills | Status: DC | PRN
Start: 2018-10-15 — End: 2018-10-18
  Administered 2018-10-16 – 2018-10-18 (×3): 500 mg via ORAL

## 2018-10-15 MED ORDER — SENNOSIDES 8.6 MG PO TAB
1 | Freq: Two times a day (BID) | ORAL | 0 refills | Status: DC
Start: 2018-10-15 — End: 2018-10-18

## 2018-10-15 NOTE — Progress Notes
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     #Chest pain, likely 2/2 reflux (resolved)  - Pt reported chest pain at OSH which prompted transfer to [place] 2/2 concern for unstable angina  - EKG at OSH without signs of ischemia. Troponins negative x2  - Pain resolved with belching prior to admission to [place]; hx of GERD  - Repeat EKG at [facility] with NSR, no ST changes. Troponin negative  > Continue PTA PPI  > CTM    #RLE Hip/Sciatica Pain  - Chronic low back pain from injury 25 years ago; right hip pain worsened starting 8/6 with acute worsening the last few days  - Xrays at OSH per nurse at OSH and pt were negative for acute pathology  - L-spine 8/25: grade 1 spondylolisthesis at L4-5 and L5-S1 levels, mild retrolisthesis of L2 on L3, disc space narrowing present at L2-3 level  - MRI L-spine 8/26: severe central spinal and right lateral recess stenosis and at least moderate bilateral foraminal stenosis, at least moderate left lateral recess stenosis at L1-L2 and L2-L3 and moderate degenerative trefoil type central spinal stenosis at L4-L5  - Ortho consulted; patient does not want surgery, can pursue nonoperative management w/ PT/OT, steroid injections  > Pain control with lidocaine patch, oxycodone, fentanyl, scheduled Tylenol  > CTM for alarm symptoms (urinary/fecal incontinence, saddle anesthesia, motor dysfunction) > Pain anesthesia consulted     #HTN  - PTA felodipine held 2/2 not being on formulary; plan to resume on discharge  - HCTZ d/c'd in setting of hyponatremia  > Continue Losartan, Atenolol  > CTM    #Hypomagnesemia, Hyponatremia  - Mg sulfate 4g given 8/25  - Na 127, serum osmolality 267 on admission  - 8/28: Na 130  > Mag-ox 400mg  BID  > Encourage fluid intake   > CTM    #Constipation  - miralax BID, docusate  > senna BID       FEN: Cardiac diet, no IVF  PPx: Lovenox  Code Status: DNAR-FI  ________________________________________________________________________    Subjective  Carmen Jones is a 84 y.o. female. No acute events overnight. This morning she is pleasant and cooperative, stating that her pain is better than yesterday. She continues to note some tenderness to palpation along her posterior right leg, especially along her posterior thigh. The patient additionally notes stiffness in her toes but her sensation remains intact. She denies any numbness, tingling, problems with incontinence, or motor dysfunction. She has not yet had a bowel movement although she has been passing gas. She states that she tried to pass a bowel movement but was unable to due to constipation. The patient denies any chest pain, shortness of breath, abdominal pain, nausea, vomiting. She spoke to orthopedic  surgery yesterday after MRI L Spine was completed, but is not interested in surgery for now. Would like to see if injections and/or PT will help with pain.    Medications  Scheduled Meds:acetaminophen (TYLENOL) tablet 1,000 mg, 1,000 mg, Oral, TID  aspirin EC tablet 81 mg, 81 mg, Oral, QDAY  atenoloL (TENORMIN) tablet 12.5 mg, 12.5 mg, Oral, QHS  cholecalciferol (VITAMIN D-3) tablet 1,000 Units, 1,000 Units, Oral, QDAY  docusate (COLACE) capsule 200 mg, 200 mg, Oral, QAM8  enoxaparin (LOVENOX) syringe 40 mg, 40 mg, Subcutaneous, QDAY(21)  lidocaine (LIDODERM) 5 % topical patch 1 patch, 1 patch, Topical, QDAY    And Verification of Patch Placement and Integrity - Lidocaine 5%, , Transdermal, BID  losartan (COZAAR) tablet 50 mg, 50 mg, Oral, BID  magnesium oxide (MAG-OX) tablet 400 mg, 400 mg, Oral, BID  pantoprazole DR (PROTONIX) tablet 40 mg, 40 mg, Oral, QDAY(21)  polyethylene glycol 3350 (MIRALAX) packet 17 g, 1 packet, Oral, BID  pravastatin (PRAVACHOL) tablet 40 mg, 40 mg, Oral, QDAY  senna (SENOKOT) tablet 1 tablet, 1 tablet, Oral, BID    Continuous Infusions:  PRN and Respiratory Meds:fentaNYL citrate PF Q2H PRN, oxyCODONE Q4H PRN, temazepam QHS PRN      Review of Systems:  A 14 point review of systems was negative except for:  Musculoskeletal: positive for R hip pain, RLE posterior pain, R calf pain, R calf cramping  GI: positive for constipation    Objective:                          Vital Signs: Last Filed                 Vital Signs: 24 Hour Range   BP: 142/86 (08/28 1215)  Temp: 36.6 ???C (97.9 ???F) (08/28 1215)  Pulse: 66 (08/28 1215)  Respirations: 18 PER MINUTE (08/28 1215)  SpO2: 95 % (08/28 1215) BP: (142-168)/(55-86)   Temp:  [36.3 ???C (97.4 ???F)-36.9 ???C (98.4 ???F)]   Pulse:  [61-68]   Respirations:  [16 PER MINUTE-18 PER MINUTE]   SpO2:  [93 %-96 %]    Intensity Pain Scale (Self Report): 7 (10/15/18 0840) Vitals:    10/11/18 2300 10/12/18 1000   Weight: 81.6 kg (180 lb) 81.6 kg (179 lb 14.3 oz)       Intake/Output Summary:  (Last 24 hours)    Intake/Output Summary (Last 24 hours) at 10/15/2018 1339  Last data filed at 10/15/2018 1255  Gross per 24 hour   Intake 1050 ml   Output 1350 ml   Net -300 ml      Stool Occurrence: 0    Physical Exam  General: Alert, cooperative woman in mild distress, appears stated age, AOx4  HEENT: Normocephalic, atraumatic, EOMs intact  Pulmonary: Clear to auscultation bilaterally  Cardiovascular: RRR, no murmurs, pulses palpable  Abdomen: Soft, non-tender, non-distended  Extremities: No cyanosis or edema, equal strength in LE bilaterally. Mild tenderness to palpation of R posterior leg Neuro: CNII-XII grossly normal. Sensation intact to touch of BLE, patellar reflex normal, Babinksi reflex normal    Lab Review  Comprehensive Metabolic Profile  CMP Latest Ref Rng & Units 10/15/2018 10/14/2018 10/13/2018 10/12/2018 07/31/2017   NA 137 - 147 MMOL/L 130(L) 130(L) 128(L) 127(L) 133(L)   K 3.5 - 5.1 MMOL/L 3.9 4.2 4.1 4.0 4.8   CL 98 - 110 MMOL/L 97(L) 93(L) 91(L) 92(L) 98   CO2 21 - 30 MMOL/L 24 26 29  26 26.0   GAP 3 - 12 9 11 8 9  -   BUN 7 - 25 MG/DL 14 14 15 15  20.0   CR 0.4 - 1.00 MG/DL 1.61 0.96 0.45 4.09 8.11   GLUX 70 - 100 MG/DL 95 914(N) 829(F) 621(H) 111(H)   CA 8.5 - 10.6 MG/DL 9.2 9.9 9.4 9.5 9.7   TP 6.0 - 8.0 G/DL 6.4 7.4 6.7 6.7 7.1   ALB 3.5 - 5.0 G/DL 3.7 4.4 3.9 4.1 3.9   ALKP 25 - 110 U/L 66 73 67 65 66   ALT 7 - 56 U/L 13 10 9 11 13    TBILI 0.3 - 1.2 MG/DL 0.7 0.5 0.7 0.8 0.86   GFR >60 mL/min >60 >60 >60 >60 -   GFRAA >60 mL/min >60 >60 >60 >60 -         Point of Care Testing  (Last 24 hours)  Glucose: 95 (10/15/18 0418)    Radiology and other Diagnostics Review:    L Spine AP & Lateral 8/25  Findings/impression:   1. A right convexity lumbar scoliosis measures 16 degrees from L1 to L4.   2. Vertebral body heights appear well-maintained.   3. Grade 1 spondylolisthesis is present at the L4-5 and L5-S1 levels.   There is mild retrolisthesis of L2 on L3. Disc space narrowing is present   at the L2-3 level.     MRI L-Spine WO Contrast 8/26      1. ???At L5-S1: Grade 1 (presumably degenerative) anterolisthesis, marked   degenerative disc disease, marked facet hypertrophy and ligamentum flavum   thickening, and superimposed large right subarticular disc extrusion   (cephalad migrated to the pedicular L5 level) results in severe central   spinal and right lateral recess stenosis and at least moderate bilateral   foraminal stenosis.       2. ???At least moderate left lateral recess stenosis at L1-L2 and L2-L3 and   moderate degenerative trefoil type central spinal stenosis at L4-L5. Additional levels of less severe degenerative stenosis (as detailed   above).       3. ???Mild right convexity lumbar curvature. Minimal anterolisthesis at   L4-L5 and slight retrolisthesis at L1-L2 and L2-L3.       4. ???Subtle lumbosacral transitional anatomy with first nonrib-bearing   vertebral body designated L1, slight lumbarization-squaring of the S1   vertebral body, and small S1-S2 vestigial disc.       Curlene Dolphin MS3     ATTESTATION    I personally performed or re-performed the history, physical exam and treatment plan for the E/M. I discussed the case with the Medical Student, and concur with the Medical Student documentation of history, physical exam and treatment plan unless otherwise noted.    Resident name:  Ellsworth Lennox, DO Date:  10/15/2018

## 2018-10-15 NOTE — Progress Notes
PHYSICAL THERAPY  PROGRESS NOTE        Name: Carmen Jones        MRN: [id number]          DOB: October 13, 1934          Age: 84 y.o.  Admission Date: 10/11/2018             LOS: 3 days        Mobility  Patient Turn/Position: Self  Progressive Mobility Level: Walk in hallway(walked laps in room)  Distance Walked (feet): 70 ft  Level of Assistance: Stand by assistance  Assistive Device: Walker  Time Tolerated: 11-30 minutes  Activity Limited By: Pain    Subjective  Significant hospital events: PMH: CAD s/p PCI in 2013 who presents from [place] with worsening right sided sciatica pain in lower extremity and concern for unstable angina. CP had resolved, Troponin negative x3  Mental / Cognitive Status: Alert;Oriented;Cooperative  Pain: Patient complains of pain;Patient does not rate pain  Pain Location: Right;Leg  Pain Description: Aching  Pain Interventions: Patient agrees to participate in therapy with modifications to session;Patient pre-medicated  Comments: Room air.   Ambulation Assist: Independent Mobility in Community without Device  Patient Owned Equipment: 4-Wheeled Walker(only started to use the past two days due to severe pain )  [facility]  Entry Stairs: No Stairs  In-Home Stairs: Elevator(available, but prior was doing 3 flights of stairs daily)  Comments: The patient notes pain started on Aug 6 and has been progressively getting worse over the past two weeks. Most severe pain she has experienced - limiting her ability to participate in activities. She is required to walk to meals as well as a [facility] bathroom and [facility]. 8/27-Reports calling Sister Darral Dash who reminded the patient that they do have scooters at the [facility] she could potentially use.     Bed Mobility/Transfer  Comments: Pt sitting at edge of bed upon arrival.   Transfer Type: Sit to/from Stand  Transfer: Assistance Level: Bed(Contact guard assist, bed in lowest position )  Transfer: Assistive Device: Nurse, adult Transfers: Type Of Assistance: Verbal Cues;For Safety Considerations  End Of Activity Status: Sitting at [facility] of Bed;Nursing Notified;Instructed Patient to Use Call Light;Instructed Patient to Request Assist with Mobility    Balance  Sitting Balance: Static Sitting Balance;Dynamic Sitting Balance;Independent  Standing Balance: Static Standing Balance;Dynamic Standing Balance;2 UE support;Standby Assist(CGA at times)    Gait  Gait Distance: 70 feet  Gait: Assistance Level: Standby Assist;Safety Considerations  Gait: Assistive Device: Technical brewer: Descriptors: Antalgic;Decreased stance time RLE;Pace: Slow;Step-To Gait;Variable step length  Activity Limited By: Complaint of Pain  Comments: Antalgic gait and slight increased push through BUEs on walker, particularly when single leg stance on RLE. No balance loss or buckling noted. Primarily limited by pain.     Activity/Exercise   Seated RLE therex x10 reps, LAQ, marches; active sustained hamstring stretch to tolerance    Education  Persons Educated: Patient  Patient Barriers To Learning: None Noted  Teaching Methods: Verbal Instruction  Patient Response: Verbalized Understanding  Topics: Plan/Goals of PT Interventions;Safety Awareness;Up with Assist Only;Mobility Progression;Exercise Program;Recommend Continued Therapy;Therapy Schedule;Equipment Recommendations    Assessment/Progress  Impaired Mobility Due To: Pain  Assessment/Progress: Should Improve w/ Continued PT  Comments: Pain somewhat increased this session limiting tolerance for ambulation distance. However pt does demonstrate improvement with transfers, contact guard assist from low seat height and standby assist from standard seat height. Pt appears to have [facility]  strength (may be slight limited due to decreased use of RLE), however pt is primarily limited by pain. Anticipate as pain more controlled pt will continue to demonstrate progress with functional mobility. AM-PAC 6 Clicks Basic Mobility Inpatient  Turning from your back to your side while in a flat bed without using bed rails: None  Moving from lying on your back to sitting on the side of a flatbed without using bedrails : None  Moving to and from a bed to a chair (including a wheelchair): A Little  Standing up from a chair using your arms (e.g. wheelchair, or bedside chair): A Little  To walk in hospital room: A Little  Climbing 3-5 steps with a railing: A Lot  Raw Score: 19  Standardized (T-scale) Score: 42.48  Basic Mobility CMS 0-100%: 36.99  CMS G Code Modifier for Basic Mobility: CJ    Goals  Goal Formulation: With Patient  Patient Will Go Supine To/From Sit: Independently  Patient Will Transfer Sit to Stand: Independently  Patient Will Ambulate: Greater than 200 Feet, Independently    Plan  Treatment Interventions: Mobility Training;Strengthening  Plan Frequency: 5 Days per Week    PT Discharge Recommendations  Recommendation: Home with intermittent supervision/assistance  Recommendation for Therapy Post Discharge: Home health  Patient Currently Requires Equipment: Walker with wheels    Anticipate pt will continue to progress toward home/prior living situation as pain more controlled. Pt would benefit from 1-2 more sessions while inpatient. Pt has neighbors who live in the same building and can check in on her.     Patient requires the use of a walker with wheels to complete ADL???s in the home including meal preparation, ambulation the bathroom for toileting, bathing and grooming, and safe home mobility.  Patient is unable to complete these ADL???s with a cane or crutch and can safely use the walker.     Therapist: Berenice Bouton, PT, DPT  Date: 10/15/2018

## 2018-10-15 NOTE — Progress Notes
OCCUPATIONAL THERAPY  PROGRESS NOTE    Name: Carmen Jones        MRN: [id number]          DOB: Nov 19, 1934          Age: 84 y.o.  Admission Date: 10/11/2018             LOS: 3 days    Mobility  Patient Turn/Position: Self  Progressive Mobility Level: Walk in room  Distance Walked (feet): 25 ft  Level of Assistance: Stand by assistance  Assistive Device: Walker  Time Tolerated: 11-30 minutes  Activity Limited By: Pain    Subjective  Pertinent Dx per Physician: h/o GERD, HLD, CAD s/p PCI in 2013, and HTN who presents as a transfer from [place] with worsening right sided sciatica pain in lower extremity and concern for unstable angina.   Precautions: Falls  Pain / Complaints: Patient agrees to participate in therapy  Pain Location: Right;Hip;Thigh;Leg  Pain Level Current: (does not rate pain)  Comments: Pt appropriate for therapy per bedside RN and presents seated EOB. Pt returns to seated EOB end of session, all needs in reach, bed alarm active.    Objective  Psychosocial Status: Willing and Cooperative to Participate    Home Living  [facility])  Home Layout: Performs ADL'S on One Level;Elevator(completes 3 flights of steps to room but elevator available)  Financial risk analyst / Tub: Pension scheme manager: Standard  Bathroom Equipment: Writer in [employer] Around Technical sales engineer Accessibility: Accessible via [employer] Equipment: Dan Humphreys  Comment: Pt has access to necessary DME at [employer] - was borrowing walker for a few days before admission.    Prior Function  Level Of Independence: Independent with ADLs and functional transfers  Receives Help From: None Needed  Other Function Comments: Pt reports she is typically very independent up until the last few days where walking became difficult. She has to walk approximately 100 feet to access communal bathroom and dining area.    Vision  Current Vision: Wears Glasses All of the Time  Comment: Pt denies acute visual changes.    ADL's Where Assessed: Standing at [facility];In Bathroom;Edge of Bed  Grooming Assist: Modified Independent  Grooming Deficits: Increased Time To Complete;Wash/Dry Hands;Teeth Care;Wash/Dry Face  LE Dressing Assist: Modified Independent  LE Dressing Deficits: Increased Time To Complete;Don/Doff R Sock;Don/Doff L Sock;Thread RLE Into Pants;Thread LLE Into Pants  Toileting Assist: Modified Independent  Toileting Deficits: Increased Time To Complete;Grab Bar Use  Comment: Pt rated Modi for safety. Stands at sink x 2 bouts for grooming tasks, use of counter for support. Use of grab bars when managing clothing and performing pericare.     ADL Mobility  Transfer Type: Sit to/from stand  Transfer: Assistance Level: To/from;Toilet;Bed;Standby assist  Transfer: Assistive Device: Agricultural consultant: Type of Assistance: For safety considerations  End of Activity Status: Sitting at edge of bed;Instructed patient to use call light;Instructed patient to request assist with mobility  Transfer Comments: Performs toilet transfer with support of grab bars.  Sitting Balance: No UE support;Static sitting balance;Dynamic sitting balance  Standing Balance: 2 UE support;Static standing balance;Standby assist;Dynamic standing balance  Gait Distance: 25 feet  Gait: Assistance Level: Standby assist  Gait: Assistive Device: Roller walker  Gait Comments: Pt ambulates in room with RW support, SBA. One minor LOB standing at EOB however pt recovers - cites RLE pain     Activity Tolerance  Endurance: 3/5 Tolerates 25-30 Minutes Exercise  w/Multiple Rests    Cognition  Overall Cognitive Status: WFL to Adequately Complete Self Care Tasks Safely  Attention: Awake/Alert  Cognition Comment: No Deficits noted for cogniton - appears to be at her baseline.    Education  Persons Educated: Patient  Barriers To Learning: None Noted  Teaching Methods: Verbal Instruction;Demonstration  Patient Response: Verbalized and Demo Understanding Topics: Role of OT, Goals for Therapy;ADL Compensatory Techniques  Goal Formulation: With Patient  Comments: Instruction re: use of ice/heat for pain relief.    Assessment  Assessment: Decreased ADL Status;Decreased Endurance;Decreased Self-Care Trans;Decreased High-Level ADLs  Prognosis: Good  Goal Formulation: Patient  Comments: Pt primary limiting factor continues to be pain. Pt progressing to [facility] for most adls and SBA for functional transfers this date, with good safety awareness and recognition of need to initiate rest breaks due to pain or fatigue.     AM-PAC 6 Clicks Daily Activity Inpatient  Putting on and taking off regular lower body clothes?: None  Bathing (Including washing, rinsing, drying): A Little  Toileting, which includes using toilet, bedpan, or urinal: None  Putting on and taking off regular upper body clothing: None  Taking care of personal grooming such as brushing teeth: None  Eating meals?: None  Daily Activity Raw Score: 23  Standardized (t-scale) score: 51.12  CMS 0-100% Score: 15.86  CMS G Code Modifier: CI    Plan  OT Frequency: 5x/week  OT Plan for Next Visit: progress functional mobility/activity tolerance in standing; introduce t-band therex HEP    ADL Goals  Patient Will Perform All ADL's: Met;w/ Stand By Assist    Functional Transfer Goals  Pt Will Perform All Functional Transfers: Met, w/ Stand By Assist    Arm Goals  Pt  Will Complete Theraband Exer: B UE, 1 Set, 10 Reps, Level 2 Theraband, w/ Good Activity Tolerance    OT Discharge Recommendations  Recommendation: Currently patient requires inpatient level of care. However, typical progression for patient condition would anticipate home with assistance at time of discharge.(pending pain resolution)    Therapist: Beverly Gust, OT  Date: 10/15/2018

## 2018-10-15 NOTE — Consults
[facility] Consult Note    Assessment Carmen Jones is a 84 y.o. female w/ acute on chronic R sciatic pain who presents with acute onset Lumbar back pain    Plan  -Operative Intervention - NTD at this very moment --> the patient has a history suggesting lumbar stenosis, as she has acute onset lumbar back pain that worsens with ambulating, walking down stairs and improves with leaning forward. MRI Lumbar spine is concerning for minimal anterolisthesis L4-5, Grade 1 L5-S1 anterolisthesis with severe central spinal stenosis and R lateral recess stenosis with moderate bilateral foraminal stenosis. There is also moderate L lateral recess stenosis L1-2, L2-3 and central stenosis of L4-5. There is also some retrolethesis at L1-2, L2-3. At this time, she does not want to explore surgical interventions, and would like to pursue non operative management such as PT and injections.   -WB Status - WBAT BLE  -Antibiotics / Tetanus - per primary  -Pain Control per primary  -Diet per primary  -PT/OT  -DVT PPX - Mechanical, Chemoprophylaxis per primary  -Dispo - Discussed with staff and he agrees that patient can pursue nonop management with PT/OT, steroid injections    Discussed with Dr. Lisette Grinder about patient today    Please page orthopedic spine resident Tracie Harrier during normal business hours otherwise page the orthopedic surgery resident on-call.      Denton Ar, MD  [phone]  --------------------------------------------------------------------------------------------------------------------------------  Admission Date: 10/11/2018                                                  Chief Complaint/Reason for Consult:  Chronic R sciatica with lumbar stenosis    History of Present Illness: Carmen Jones is a 84 y.o. female w/ HLD, CAD s/p PCI in 2013, HTN who was a transfer from [place] 2/2 worsening R sided sciatica pain. At bedside, she states that she has had R sciatica pain for over 20 years after twisting her back while getting into her car. She describes her R sciatica as burning and it originates in R buttock and radiates down lateral leg and stops at lateral midfoot. She endorses some residual weakness in RLE since sciatica diagnosis, but does mention that her R sciatica pain has increased over the last couple weeks, which prompted her to go to ED at OSH. Off note, she also endorses acute onset pain in mid lumbar region. It is nagging, and is grip-like in that it starts in mid lower lumbar spina and wraps around hips to the anterior abdomen. She believes the pain is exacertbated with ambulating and walking down stairs. Her pain improves when she leans forward and rests for about 30s to one minute. She denies bowel/bladder incontinence, fevers, chills, saddle anesthesia, acute onset BLE weakness (other than residual RLE weakness), but she does have trouble ambulating 2/2 pain. MRI Lumbar spine done at  is concerning for Grade 1 L4-5, L5-S1 spondylolisthesis with multiple lumbar level central stenosis and lateral recess stenosis.      Medical History:   Diagnosis Date   ??? GERD (gastroesophageal reflux disease)    ??? HTN (hypertension)      No past surgical history on file.    Social Hx:  -Tobacco Hx: n/a,   -Alcohol Hx: n/a  -Illicit substance: n/a    Family Hx:  - There is otherwise no pertinent family  hx    Allergies:  Amlodipine and Lisinopril    Outpatient Medications as of 10/14/2018   Medication Sig Dispense Refill   ??? acetaminophen (TYLENOL) 500 mg tablet Take 500 mg by mouth as Needed for Pain. Max of 4,000 mg of acetaminophen in 24 hours.     ??? aspirin EC 81 mg tablet Take 81 mg by mouth daily.     ??? atenoloL (TENORMIN) 25 mg tablet Take one-half tablet by mouth at bedtime daily. 45 tablet 3   ??? cholecalciferol (Vitamin D3) (VITAMIN D-3) 1,000 units tablet Take 1,000 Units by mouth daily.     ??? docusate (COLACE) 100 mg capsule Take 200 mg by mouth every morning. ??? esomeprazole DR(+) (NEXIUM) 40 mg capsule Take 40 mg by mouth every morning. Take on an empty stomach at least 1 hour before or 2 hours after food.     ??? felodipine (PLENDIL) 5 mg ER tablet TAKE 1 TABLET BY MOUTH DAILY. TAKE ON AN EMPTY STOMACH. 90 tablet 3   ??? hydroCHLOROthiazide (HYDRODIURIL) 25 mg tablet TAKE 1 TABLET BY MOUTH EVERY DAY IN THE MORNING 90 tablet 3   ??? losartan (COZAAR) 50 mg tablet TAKE 1 TABLET BY MOUTH TWICE A DAY 180 tablet 3   ??? magnesium oxide (MAG-OX) 400 mg tablet Take 400 mg by mouth twice daily.     ??? naproxen sodium (ALEVE) 220 mg tablet Take  by mouth as Needed. Taking 1 tablet twice daily     ??? pravastatin (PRAVACHOL) 40 mg tablet TAKE 1 TABLET BY MOUTH EVERY DAY 90 tablet 3   ??? temazepam (RESTORIL) 15 mg capsule Take 15 mg by mouth at bedtime as needed.           Review of Systems:    Positive: pain  Negative:  Acute weakness  A 10 Point Review of Systems was otherwise negative.    Allergies:  Amlodipine and Lisinopril  Meds -   No current facility-administered medications on file prior to encounter.      Current Outpatient Medications on File Prior to Encounter   Medication Sig Dispense Refill   ??? acetaminophen (TYLENOL) 500 mg tablet Take 500 mg by mouth as Needed for Pain. Max of 4,000 mg of acetaminophen in 24 hours.     ??? aspirin EC 81 mg tablet Take 81 mg by mouth daily.     ??? atenoloL (TENORMIN) 25 mg tablet Take one-half tablet by mouth at bedtime daily. 45 tablet 3   ??? cholecalciferol (Vitamin D3) (VITAMIN D-3) 1,000 units tablet Take 1,000 Units by mouth daily.     ??? docusate (COLACE) 100 mg capsule Take 200 mg by mouth every morning.     ??? esomeprazole DR(+) (NEXIUM) 40 mg capsule Take 40 mg by mouth every morning. Take on an empty stomach at least 1 hour before or 2 hours after food.     ??? felodipine (PLENDIL) 5 mg ER tablet TAKE 1 TABLET BY MOUTH DAILY. TAKE ON AN EMPTY STOMACH. 90 tablet 3   ??? hydroCHLOROthiazide (HYDRODIURIL) 25 mg tablet TAKE 1 TABLET BY MOUTH EVERY DAY IN THE MORNING 90 tablet 3   ??? losartan (COZAAR) 50 mg tablet TAKE 1 TABLET BY MOUTH TWICE A DAY 180 tablet 3   ??? magnesium oxide (MAG-OX) 400 mg tablet Take 400 mg by mouth twice daily.     ??? naproxen sodium (ALEVE) 220 mg tablet Take  by mouth as Needed. Taking 1 tablet twice daily     ???  pravastatin (PRAVACHOL) 40 mg tablet TAKE 1 TABLET BY MOUTH EVERY DAY 90 tablet 3   ??? temazepam (RESTORIL) 15 mg capsule Take 15 mg by mouth at bedtime as needed.           Physical Exam:    Blood pressure (!) 167/61, pulse 61, temperature 36.3 ???C (97.4 ???F), height 167.6 cm (65.98), weight 81.6 kg (179 lb 14.3 oz), SpO2 93 %.    General appearance: No acute distress   Lungs: unlabored respirations  Heart: RRR  Abdomen: Soft, non-tender.   Extremities: No edema/contractures/ecchymosis    Neurologic Exam:   Mental Status: Awake, alert and oriented   Pupils: Pupils equal round and reactive to light     MOTOR:  Upper Ext. Deltoid Triceps Biceps Wrist Ext Wrist Flex Grip Interossei   Right 5 5 5 5 5 5 5    Left 5 5 5 5 5 5 5      Lower Ext. Hip Flex Quads Hamstrings Plantarflex Dorsiflex EHL   Right 5 5 5 5 5 5    Left 5 5 5 5  4+ 5     SENSATION:  Upper extremity: Sensation intact to light touch and pin prick C5-T1 distributions  Lower extremity: Sensation intact to light touch and pin prick L3-S1 distributions  No saddle anesthesia    REFLEXES:   Triceps Biceps Brachiorad Patellar Achilles   Right 2+ 2+ 2+ 2+ 2+   Left 2+ 2+ 2+ 2+ 2+     -Negative Hoffman's bilaterally  -Babinski's plantar bilaterally  -No clonus    Lab/Radiology/Other Diagnostic Tests:    CBC w/Diff   Lab Results   Component Value Date/Time    WBC 9.5 10/14/2018 04:43 AM    HGB 14.6 10/14/2018 04:43 AM    HCT 43.4 10/14/2018 04:43 AM    PLTCT 381 10/14/2018 04:43 AM        Inflammatory Markers   No results found for: ESR, CRP     Coagulation Studies   Lab Results   Component Value Date/Time    PTT 28.4 11/06/2011 09:50 AM    INR 1.8 06/30/2012 Basic Metabolic Profile   Lab Results   Component Value Date/Time    NA 130 (L) 10/14/2018 04:43 AM    K 4.2 10/14/2018 04:43 AM    CL 93 (L) 10/14/2018 04:43 AM    CO2 26 10/14/2018 04:43 AM    GAP 11 10/14/2018 04:43 AM    BUN 14 10/14/2018 04:43 AM    CR 0.69 10/14/2018 04:43 AM    GLU 131 (H) 10/14/2018 04:43 AM        Radiology: Reviewed      Denton Ar, MD  [phone]

## 2018-10-15 NOTE — Consults
Anesthesia Chronic Pain Consult Note      Admission Date: 10/11/2018                                                LOS: 3 days    Reason for Consult: Patient with worsening right side sciatica and low back pain; MRI L-spine completed which showed severe central stenosis and R lateral recess at L5-S1, etc. Ortho consulted and patient not desiring back surgery at this time.    Consult type: Opinion    Assessment/Plan    Carmen Jones is a 84 y.o. female with a medical history of GERD, HLD, CAD s/p PCI in 2013, HTN.  She presented on 8/24 as a transfer from OSH for worsening right side sciatic pain in addition to chest pain initially concerning for unstable angina.  Chest pain has resolved at this time.  EKG and troponins negative.  Anesthesia chronic pain service was consulted to evaluate for interventions pertaining to right sided sciatic pain.  Orthopedic spine has evaluated patient and there are no recs for surgical interventions at this time as patient does not want to pursue this option.  MRI L spine: At L5-S1: Grade 1 (presumably degenerative) anterolisthesis, marked degenerative disc disease, marked facet hypertrophy and ligamentum flavum thickening, and superimposed large right subarticular disc extrusion (cephalad migrated to the pedicular L5 level) results in severe central spinal and right lateral recess stenosis and at least moderate bilateral foraminal stenosis. ???At least moderate left lateral recess stenosis at L1-L2 and L2-L3 and moderate degenerative trefoil type central spinal stenosis at L4-L5. On physical exam, patient does display tenderness with palpation near the L5 vertebrae.  SLR - bilaterally at the time of this exam, but she does subjectively report radicular pains to the RLE in a posterolateral distribution down to the ankle.  Motor strength intact to bilateral LEs sensation intact.    Lumbosacral radiculopathy  -can plan for possible right L4-S1 TFESI on Monday 8/31, will speak with attending on that day if this is a possibility  -hold lovenox the Sunday evening 8/30 for possible injection on Monday   -primary team can consider gabapentin 100mg  TID for neuropathic pain  -agree with tylenol and lidocaine patch  -could try use of heating pad to low back for possible muscular pains   -recommend continued participation with PT and OT for overall muscle strengthening and stability  -pt agreeable to plan        Patient can follow up as an outpatient in the [facility] for ongoing evaluation and treatment by calling [phone]  Thank you for this consult. Please page with questions.    [name], APRN-NP  Interventional Pain  Anesthesia Chronic Pain Consult Pager [phone]  ______________________________________________________________________    History of Present Illness: Carmen Jones is a 84 y.o. female with a medical history of GERD, HLD, CAD s/p PCI in 2013, HTN.  She presented on 8/24 as a transfer from OSH for worsening right side sciatic pain in addition to chest pain initially concerning for unstable angina.  Chest pain has resolved at this time.  EKG and troponins negative.    She describes the pain as follows:  -acute severe pain started 8/7, but has been dealing with this pain for about 25 years.  Onset noted 25 years ago when she was getting into car and felt a  pop in her back  -it is localized to right low back and RLE   -numbness/tingling: none  -initial inciting injury or event: woke up in morning with pain.  Thinks may have slept worong  -pain is intermittent  -the pain is described as sharp  -alleviating factors: PT/OT. Leaning forward   -aggravating factors: prolonged sitting, sitting on hard surfaces  -the pain radiates: Yes to right buttock and down RLE in posterolateral pattern  -VAS 5/10  -loss of bowel or bladder control: No  -unexplained weight loss: No  -experiencing weakness: Yes , RLE    PRIOR MEDICATIONS:   Effective  Acetaminophen  NSAID    Ineffective Unable to tolerate      Never  Gabapentin   Lyrica  Ami/Nortriptyline  Cymbalta  Tizanidine      Past Medical History:  Medical History:   Diagnosis Date   ??? GERD (gastroesophageal reflux disease)    ??? HTN (hypertension)        Family History:  Family History   Problem Relation Age of Onset   ??? Coronary Artery Disease Brother    ??? Hypertension Brother    ??? Hypertension Sister    ??? Hypertension Sister    ??? Aortic Disease/Dissection Sister    ??? Hypertension Sister    ??? Coronary Artery Disease Brother    ??? Hypertension Brother    ??? Coronary Artery Disease Brother    ??? Aortic Disease/Dissection Brother    ??? Hypertension Brother    ??? Coronary Artery Disease Brother    ??? Hypertension Brother    ??? Coronary Artery Disease Brother    ??? Hypertension Brother        Social History:  Lives in [place] [id number]    Social History     Socioeconomic History   ??? Marital status: Single     Spouse name: Not on file   ??? Number of children: Not on file   ??? Years of education: Not on file   ??? Highest education level: Not on file   Occupational History   ??? Not on file   Tobacco Use   ??? Smoking status: Never Smoker   ??? Smokeless tobacco: Never Used   Substance and Sexual Activity   ??? Alcohol use: Yes     Comment: rarely   ??? Drug use: No   ??? Sexual activity: Not on file   Other Topics Concern   ??? Not on file   Social History Narrative   ??? Not on file       Allergies:  Allergies   Allergen Reactions   ??? Amlodipine UNKNOWN   ??? Lisinopril UNKNOWN     I got shaky        Medications:    Current Facility-Administered Medications:   ???  acetaminophen (TYLENOL) tablet 1,000 mg, 1,000 mg, Oral, TID, Slimmer, Theodis Sato, MD, 1,000 mg at 10/15/18 0839  ???  aspirin EC tablet 81 mg, 81 mg, Oral, QDAY, Slimmer, Theodis Sato, MD, 81 mg at 10/15/18 0865  ???  atenoloL (TENORMIN) tablet 12.5 mg, 12.5 mg, Oral, QHS, Slimmer, Theodis Sato, MD, 12.5 mg at 10/14/18 2001  ???  cholecalciferol (VITAMIN D-3) tablet 1,000 Units, 1,000 Units, Oral, QDAY, Slimmer, Theodis Sato, MD, 1,000 Units at 10/15/18 0839  ???  docusate (COLACE) capsule 200 mg, 200 mg, Oral, QAM8, Slimmer, Theodis Sato, MD, 200 mg at 10/15/18 0839  ???  enoxaparin (LOVENOX) syringe 40 mg, 40 mg, Subcutaneous, QDAY(21), Slimmer, Theodis Sato, MD,  40 mg at 10/14/18 2001  ???  fentaNYL citrate PF (SUBLIMAZE) injection 25-50 mcg, 25-50 mcg, Intravenous, Q2H PRN, Slimmer, Theodis Sato, MD, 25 mcg at 10/15/18 0544  ???  lidocaine (LIDODERM) 5 % topical patch 1 patch, 1 patch, Topical, QDAY, 1 patch at 10/15/18 0839 **AND** Verification of Patch Placement and Integrity - Lidocaine 5%, , Transdermal, BID, Rob Bunting, MD  ???  losartan (COZAAR) tablet 50 mg, 50 mg, Oral, BID, Slimmer, Theodis Sato, MD, 50 mg at 10/15/18 0844  ???  magnesium oxide (MAG-OX) tablet 400 mg, 400 mg, Oral, BID, Slimmer, Theodis Sato, MD, 400 mg at 10/15/18 0865  ???  oxyCODONE (ROXICODONE) tablet 5 mg, 5 mg, Oral, Q4H PRN, Slimmer, Theodis Sato, MD, 5 mg at 10/15/18 7846  ???  pantoprazole DR (PROTONIX) tablet 40 mg, 40 mg, Oral, QDAY(21), Slimmer, Theodis Sato, MD, 40 mg at 10/14/18 2001  ???  polyethylene glycol 3350 (MIRALAX) packet 17 g, 1 packet, Oral, BID, Ellsworth Lennox, DO, 17 g at 10/15/18 9629  ???  pravastatin (PRAVACHOL) tablet 40 mg, 40 mg, Oral, QDAY, Slimmer, Theodis Sato, MD, 40 mg at 10/15/18 5284  ???  senna (SENOKOT) tablet 1 tablet, 1 tablet, Oral, BID, Eddy, John, DO  ???  temazepam (RESTORIL) capsule 15 mg, 15 mg, Oral, QHS PRN, Slimmer, Theodis Sato, MD, 15 mg at 10/14/18 2154    Review of Systems:  Eddis Ranz denies any recent fevers, chills, infection, antibiotics, bowel or bladder incontinence, saddle anesthesia, bleeding issues, or recent anticoagulant.  All 14 systems reviewed and found to be negative except as above and as follows. +recent anticoagulant     Vital Signs:  Last Filed in 24 hours Vital Signs:  24 hour Range    BP: 142/86 (08/28 1215)  Temp: 36.6 ???C (97.9 ???F) (08/28 1215)  Pulse: 66 (08/28 1215) Respirations: 18 PER MINUTE (08/28 1215)  SpO2: 95 % (08/28 1215) BP: (142-168)/(55-86)   Temp:  [36.3 ???C (97.4 ???F)-36.9 ???C (98.4 ???F)]   Pulse:  [61-68]   Respirations:  [16 PER MINUTE-18 PER MINUTE]   SpO2:  [93 %-96 %]      Physical Exam:  General: The patient is a well-developed, well nourished 84 y.o. female in no acute distress.   HEENT: Head is normocephalic and atraumatic. Pupils are equal and reactive to light bilaterally.   Cardiac: Based on palpation, pulse appears to be regular rate and rhythm.   Pulmonary: The patient has unlabored respirations and bilateral symmetric chest excursion.   Abdomen: Soft, nontender, and nondistended. There is no rebound or guarding.   Extremities: No clubbing, cyanosis, or edema.   Neurologic:   The patient is alert and oriented times 3.   Cranial nerves II through XII intact  MS: moves all extremities equally and spontaneously     Lumbar spine:  Appearance: No lesions or deformity  Lumbar tenderness: Yes near L5 and right side paraspinals  SI joint tenderness: Right  Pain with extension: Yes  Pain with lateral flexion: Bilateral  Lower extremity strength: 5/5 bilaterally  Sensation to light touch: Intact and equal in the bilateral lower extremities  Straight leg raise positive?: None  Reflexes:  2/4 in bilateral patellar and achilles tendons      Results for orders placed during the hospital encounter of 10/11/18   MRI L-SPINE WO CONTRAST    Narrative EXAM: MRI L-SPINE     HISTORY: Low back pain,    Technique: Multiple sagittal and axial MR sequences were obtained of the  lumbar spine.    Comparison: L-spine radiographs October 12, 2018.    FINDINGS:    There is transitional vertebral anatomy. First nonrib-bearing vertebral body is designated L1. There is some partial squaring and lumbarization of S1 with a vestigial disc at S1-S2.    Mild rightward convexity. Grade 1 (presumably degenerative) anterolisthesis at L5-S1. Additional minimal anterolisthesis of anterolisthesis at L4-L5 and slight retrolisthesis at L1-L2 and L2-L3. The vertebral body heights are maintained. Few scattered benign intraosseous hemangiomas. Degenerative marrow endplate changes at T11-T12 and L5-S1.  The conus is normal in position and appearance at the L1 level. Bilateral articular facet effusions and tiny posterior paraspinous synovial cysts at L5-S1. There is Baastrup disease with degeneration of the spinous processes and at least moderate multilevel ligamentum flavum thickening throughout the lumbar spine. Fatty atrophy of the posterior paraspinous musculature.    T12-L1: Marked degenerative disc disease and circumferential disc bulge. No significant central spinal or neuroforaminal stenosis.  L1-L2: Minimal retrolisthesis, moderate sized broad-based left subarticular/foraminal and extraforaminal disc herniation and facet hypertrophy (left greater than right). Results in mild eccentric left central spinal stenosis, at least moderate left lateral recess stenosis and moderate left-sided foraminal stenosis.  L2-L3: Minimal retrolisthesis, degenerative disc disease (eccentric to the left), broad-based left-sided disc osteophyte complex, ligament of flavum thickening and facet hypertrophy. Results in mild to moderate central spinal stenosis (eccentric to the left) and at least moderate left and mild right lateral recess stenosis and moderate left neural foraminal stenosis.Marland Kitchen  L3-L4: Circumferential disc bulge, facet hypertrophy and ligament flavum thickening results in mild to moderate trefoil type central spinal stenosis and mild bilateral foraminal stenosis.  L4-L5: Slight anterolisthesis, circumferential disc bulge, facet hypertrophy and ligamentum flavum thickening results in moderate trefoil type central spinal stenosis and mild bilateral foraminal stenosis.  L5-S1: Marked degenerative disc disease, grade 1 anterolisthesis, facet hypertrophy and ligamentum flavum thickening as well as and a large superimposed right subarticular disc extrusion, cephalad migrated to the pedicular L5 level. This results in severe central spinal and right lateral recess stenosis and moderate to severe bilateral foraminal stenosis.        Impression 1.  At L5-S1: Grade 1 (presumably degenerative) anterolisthesis, marked degenerative disc disease, marked facet hypertrophy and ligamentum flavum thickening, and superimposed large right subarticular disc extrusion (cephalad migrated to the pedicular L5 level) results in severe central spinal and right lateral recess stenosis and at least moderate bilateral foraminal stenosis.  2.  At least moderate left lateral recess stenosis at L1-L2 and L2-L3 and moderate degenerative trefoil type central spinal stenosis at L4-L5. Additional levels of less severe degenerative stenosis (as detailed above).  3.  Mild right convexity lumbar curvature. Minimal anterolisthesis at L4-L5 and slight retrolisthesis at L1-L2 and L2-L3.  4.  Subtle lumbosacral transitional anatomy with first nonrib-bearing vertebral body designated L1, slight lumbarization-squaring of the S1 vertebral body, and small S1-S2 vestigial disc.    By my electronic signature, I attest that I have personally reviewed the images for this examination and formulated the interpretations and opinions expressed in this report       Finalized by Desma Maxim, M.D. on 10/14/2018 9:34 AM. Dictated by Karmen Stabs, MD on 10/14/2018 8:07 AM.         Lab/Radiology/Other Diagnostic Tests:  Hematology:    Lab Results   Component Value Date    HGB 13.7 10/15/2018    HCT 40.9 10/15/2018    PLTCT 321 10/15/2018    WBC 6.7 10/15/2018  NEUT 62 10/15/2018    ANC 4.18 10/15/2018    ALC 1.56 10/15/2018    MONA 12 10/15/2018    AMC 0.80 10/15/2018    ABC 0.06 10/15/2018    MCV 88.6 10/15/2018    MCHC 33.6 10/15/2018    MPV 7.2 10/15/2018    RDW 14.4 10/15/2018   , Coagulation: Lab Results   Component Value Date    PTT 28.4 11/06/2011    INR 1.8 06/30/2012   , General Chemistry:    Lab Results   Component Value Date    NA 130 10/15/2018    K 3.9 10/15/2018    CL 97 10/15/2018    GAP 9 10/15/2018    BUN 14 10/15/2018    CR 0.70 10/15/2018    GLU 95 10/15/2018    CA 9.2 10/15/2018    ALBUMIN 3.7 10/15/2018    LACTIC 1.0 11/07/2011    MG 1.9 10/15/2018    TOTBILI 0.7 10/15/2018    and Enzymes:    Lab Results   Component Value Date    AST 20 10/15/2018    ALT 13 10/15/2018    ALKPHOS 66 10/15/2018

## 2018-10-16 LAB — COMPREHENSIVE METABOLIC PANEL: Lab: 129 MMOL/L — ABNORMAL LOW (ref 137–147)

## 2018-10-16 LAB — MAGNESIUM: Lab: 2 mg/dL — ABNORMAL LOW (ref 1.6–2.6)

## 2018-10-16 LAB — CBC AND DIFF: Lab: 5.8 K/UL — ABNORMAL LOW (ref 4.5–11.0)

## 2018-10-16 MED ORDER — EMU OIL 120ML
Freq: Two times a day (BID) | TOPICAL | 0 refills | Status: DC
Start: 2018-10-16 — End: 2018-10-18
  Administered 2018-10-16 – 2018-10-17 (×2): 120.000 mL via TOPICAL

## 2018-10-16 NOTE — Progress Notes
Assumed pt care at 1900.  A/Ox4. Tolerating RA. SR c 1* on tele. VSS per trend.  Adequate UOP but inaccurate d/t pt voiding in toilet. Last BM 8/28  Pain tolerated with current regimen. See MAR  No incisions/wounds noted  HFR bundle in place. Call light within reach. Will continue to monitor.

## 2018-10-16 NOTE — Progress Notes
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     #Chest pain, likely 2/2 reflux (resolved)  - Pt reported chest pain at OSH which prompted transfer to [place] 2/2 concern for unstable angina  - EKG at OSH without signs of ischemia. Troponins negative x2  - Pain resolved with belching prior to admission to [place]; hx of GERD  - Repeat EKG at [place] with NSR, no ST changes. Troponin negative  > Continue PTA PPI  > CTM    #RLE Hip/Sciatica Pain  - Chronic low back pain from injury 25 years ago; right hip pain worsened starting 8/6 with acute worsening the last few days  - Xrays at OSH per nurse at OSH and pt were negative for acute pathology  - L-spine 8/25: grade 1 spondylolisthesis at L4-5 and L5-S1 levels, mild retrolisthesis of L2 on L3, disc space narrowing present at L2-3 level  - MRI L-spine 8/26: severe central spinal and right lateral recess stenosis and at least moderate bilateral foraminal stenosis, at least moderate left lateral recess stenosis at L1-L2 and L2-L3 and moderate degenerative trefoil type central spinal stenosis at L4-L5  - Ortho consulted; patient does not want surgery, can pursue nonoperative management w/ PT/OT, steroid injections  > Pain control with lidocaine patch, oxycodone, fentanyl, scheduled Tylenol, emu oil  > CTM for alarm symptoms (urinary/fecal incontinence, saddle anesthesia, motor dysfunction) > Pain anesthesia consulted: plan for L4-S1 TFESI 8/31 - hold lovenox night prior    #HTN  - PTA felodipine held 2/2 not being on formulary; plan to resume on discharge  - HCTZ d/c'd in setting of hyponatremia  > Continue Losartan, Atenolol  > CTM    #Hypomagnesemia, Hyponatremia  - Mg sulfate 4g given 8/25  - Na 127, serum osmolality 267 on admission  - 8/28: Na 130  > Mag-ox 400mg  BID  > Encourage fluid intake   > CTM    #Constipation  - miralax BID, docusate  > senna BID       FEN: Cardiac diet, no IVF  PPx: Lovenox  Code Status: DNAR-FI  ________________________________________________________________________    Subjective  Carmen Jones is a 84 y.o. female. No acute events overnight. Still having R leg pain. Had a normal BM last night. Eating and sleeping well. Planning for pain injection Monday morning.    Medications  Scheduled Meds:acetaminophen (TYLENOL) tablet 1,000 mg, 1,000 mg, Oral, TID  aspirin EC tablet 81 mg, 81 mg, Oral, QDAY  atenoloL (TENORMIN) tablet 12.5 mg, 12.5 mg, Oral, QHS  cholecalciferol (VITAMIN D-3) tablet 1,000 Units, 1,000 Units, Oral, QDAY  docusate (COLACE) capsule 200 mg, 200 mg, Oral, QAM8  enoxaparin (LOVENOX) syringe 40 mg, 40 mg, Subcutaneous, QDAY(21)  lidocaine (LIDODERM) 5 % topical patch  1 patch, 1 patch, Topical, QDAY    And  Verification of Patch Placement and Integrity - Lidocaine 5%, , Transdermal, BID  losartan (COZAAR) tablet 50 mg, 50 mg, Oral, BID  magnesium oxide (MAG-OX) tablet 400 mg, 400 mg, Oral, BID  pantoprazole DR (PROTONIX) tablet 40 mg, 40 mg, Oral, QDAY(21)  polyethylene glycol 3350 (MIRALAX) packet 17 g, 1 packet, Oral, BID  pravastatin (PRAVACHOL) tablet 40 mg, 40 mg, Oral, QDAY  senna (SENOKOT) tablet 1 tablet, 1 tablet, Oral, BID    Continuous Infusions:  PRN and Respiratory Meds:calcium carbonate Q6H PRN, fentaNYL citrate PF Q2H PRN, oxyCODONE Q4H PRN, temazepam QHS PRN      Review of Systems:  A 14 point review of systems was negative except for: Musculoskeletal: positive for R hip pain, RLE posterior pain, R calf pain, R calf cramping      Objective:                          Vital Signs: Last Filed                 Vital Signs: 24 Hour Range   BP: 129/52 (08/29 0351)  Temp: 36.4 ???C (97.5 ???F) (08/29 0351)  Pulse: 59 (08/29 0351)  Respirations: 18 PER MINUTE (08/29 0351)  SpO2: 96 % (08/29 0351) BP: (129-168)/(52-86)   Temp:  [36.4 ???C (97.5 ???F)-36.8 ???C (98.3 ???F)]   Pulse:  [59-70]   Respirations:  [18 PER MINUTE]   SpO2:  [95 %-96 %]    Intensity Pain Scale (Self Report): 5 (10/16/18 0308) Vitals:    10/11/18 2300 10/12/18 1000   Weight: 81.6 kg (180 lb) 81.6 kg (179 lb 14.3 oz)       Intake/Output Summary:  (Last 24 hours)    Intake/Output Summary (Last 24 hours) at 10/16/2018 1610  Last data filed at 10/16/2018 0042  Gross per 24 hour   Intake 1600 ml   Output 0 ml   Net 1600 ml      Stool Occurrence: 0    Physical Exam  General: Alert, cooperative woman in mild distress, appears stated age, AOx4  HEENT: Normocephalic, atraumatic, EOMs intact  Pulmonary: Clear to auscultation bilaterally  Cardiovascular: RRR, no murmurs, pulses palpable  Abdomen: Soft, non-tender, non-distended  Extremities: No cyanosis or edema, equal strength in LE bilaterally. Mild tenderness to palpation of R posterior leg  Neuro: CNII-XII grossly normal. Sensation intact to touch of BLE    Lab Review  Comprehensive Metabolic Profile  CMP Latest Ref Rng & Units 10/16/2018 10/15/2018 10/14/2018 10/13/2018 10/12/2018   NA 137 - 147 MMOL/L 129(L) 130(L) 130(L) 128(L) 127(L)   K 3.5 - 5.1 MMOL/L 4.1 3.9 4.2 4.1 4.0   CL 98 - 110 MMOL/L 97(L) 97(L) 93(L) 91(L) 92(L)   CO2 21 - 30 MMOL/L 26 24 26 29 26    GAP 3 - 12 6 9 11 8 9    BUN 7 - 25 MG/DL 15 14 14 15 15    CR 0.4 - 1.00 MG/DL 9.60 4.54 0.98 1.19 1.47   GLUX 70 - 100 MG/DL 829(F) 95 621(H) 086(V) 127(H)   CA 8.5 - 10.6 MG/DL 9.0 9.2 9.9 9.4 9.5   TP 6.0 - 8.0 G/DL 5.9(L) 6.4 7.4 6.7 6.7   ALB 3.5 - 5.0 G/DL 3.6 3.7 4.4 3.9 4.1 ALKP 25 - 110 U/L 51 66 73 67 65   ALT 7 - 56 U/L 13 13 10 9  11  TBILI 0.3 - 1.2 MG/DL 0.6 0.7 0.5 0.7 0.8   GFR >60 mL/min >60 >60 >60 >60 >60   GFRAA >60 mL/min >60 >60 >60 >60 >60         Point of Care Testing  (Last 24 hours)  Glucose: (!) 101 (10/16/18 0524)    Radiology and other Diagnostics Review:    L Spine AP & Lateral 8/25  Findings/impression:   1. A right convexity lumbar scoliosis measures 16 degrees from L1 to L4.   2. Vertebral body heights appear well-maintained.   3. Grade 1 spondylolisthesis is present at the L4-5 and L5-S1 levels.   There is mild retrolisthesis of L2 on L3. Disc space narrowing is present   at the L2-3 level.     MRI L-Spine WO Contrast 8/26      1. ???At L5-S1: Grade 1 (presumably degenerative) anterolisthesis, marked   degenerative disc disease, marked facet hypertrophy and ligamentum flavum   thickening, and superimposed large right subarticular disc extrusion   (cephalad migrated to the pedicular L5 level) results in severe central   spinal and right lateral recess stenosis and at least moderate bilateral   foraminal stenosis.       2. ???At least moderate left lateral recess stenosis at L1-L2 and L2-L3 and   moderate degenerative trefoil type central spinal stenosis at L4-L5.   Additional levels of less severe degenerative stenosis (as detailed   above).       3. ???Mild right convexity lumbar curvature. Minimal anterolisthesis at   L4-L5 and slight retrolisthesis at L1-L2 and L2-L3.       4. ???Subtle lumbosacral transitional anatomy with first nonrib-bearing   vertebral body designated L1, slight lumbarization-squaring of the S1   vertebral body, and small S1-S2 vestigial disc.       Ellsworth Lennox, DO

## 2018-10-17 LAB — MAGNESIUM: Lab: 1.9 mg/dL — ABNORMAL LOW (ref 1.6–2.6)

## 2018-10-17 LAB — CBC AND DIFF: Lab: 5.9 K/UL (ref 4.5–11.0)

## 2018-10-17 LAB — COMPREHENSIVE METABOLIC PANEL: Lab: 131 MMOL/L — ABNORMAL LOW (ref >60)

## 2018-10-17 NOTE — Progress Notes
Assumed pt care at 0700.  VSS per trend, A&Ox4, tolerating RA, SR c 1AVB on tele.  Leg/hip pain controlled with current regimen.  Denies n/v, SOA.  UOP adequate but inaccurate d/t voiding in toilet. BM x3 during shift.  High fall risk bundle in place, call light within reach, will cont to monitor.    12000-SBP elevated to 160/170. Pt had recently ambulated during both BP check, also in pain. Pain meds given. Team notified, ordered to recheck in 1hr. Recheck BP 134.     1730-No further events/changes during shift. Will cont to monitor and handoff to night RN.

## 2018-10-17 NOTE — Progress Notes
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     #Chest pain, likely 2/2 reflux (resolved)  - Pt reported chest pain at OSH which prompted transfer to [place] 2/2 concern for unstable angina  - EKG at OSH without signs of ischemia. Troponins negative x2  - Pain resolved with belching prior to admission to [facility]; hx of GERD  - Repeat EKG at [facility] with NSR, no ST changes. Troponin negative  > Continue PTA PPI  > CTM    #RLE Hip/Sciatica Pain  - Chronic low back pain from injury 25 years ago; right hip pain worsened starting 8/6 with acute worsening the last few days  - Xrays at OSH per nurse at OSH and pt were negative for acute pathology  - L-spine 8/25: grade 1 spondylolisthesis at L4-5 and L5-S1 levels, mild retrolisthesis of L2 on L3, disc space narrowing present at L2-3 level  - MRI L-spine 8/26: severe central spinal and right lateral recess stenosis and at least moderate bilateral foraminal stenosis, at least moderate left lateral recess stenosis at L1-L2 and L2-L3 and moderate degenerative trefoil type central spinal stenosis at L4-L5  - Ortho consulted; patient does not want surgery, can pursue nonoperative management w/ PT/OT, steroid injections  > Pain control with lidocaine patch, oxycodone, fentanyl, scheduled Tylenol, emu oil  > CTM for alarm symptoms (urinary/fecal incontinence, saddle anesthesia, motor dysfunction) > Pain anesthesia consulted: plan for L4-S1 TFESI 8/31 - hold lovenox night prior    #HTN  - PTA felodipine held 2/2 not being on formulary; plan to resume on discharge  - HCTZ d/c'd in setting of hyponatremia; plan to hold on discharge  > Continue Losartan, Atenolol  > CTM    #Hypomagnesemia, Hyponatremia  - Mg sulfate 4g given 8/25  - Na 127, serum osmolality 267 on admission  - 8/28: Na 130  > Mag-ox 400mg  BID  > Encourage fluid intake   > CTM    #Constipation  - miralax BID, docusate  > senna BID       FEN: Cardiac diet, no IVF  PPx: Lovenox  Code Status: DNAR-FI    Discussed with Dr. Donalee Citrin  ________________________________________________________________________    Subjective  Carmen Jones is a 84 y.o. female. No acute events overnight. Still having R leg pain, but better today. Eating and sleeping well. Still planning for pain injection Monday morning.    Medications  Scheduled Meds:acetaminophen (TYLENOL) tablet 1,000 mg, 1,000 mg, Oral, TID  aspirin EC tablet 81 mg, 81 mg, Oral, QDAY  atenoloL (TENORMIN) tablet 12.5 mg, 12.5 mg, Oral, QHS  cholecalciferol (VITAMIN D-3) tablet 1,000 Units, 1,000 Units, Oral, QDAY  docusate (COLACE) capsule 200 mg, 200 mg, Oral, QAM8  emu oil, , Topical, BID  lidocaine (LIDODERM) 5 % topical patch 1 patch, 1 patch, Topical, QDAY    And  Verification of Patch Placement and Integrity - Lidocaine 5%, , Transdermal, BID  losartan (COZAAR) tablet 50 mg, 50 mg, Oral, BID  magnesium oxide (MAG-OX) tablet 400 mg, 400 mg, Oral, BID  pantoprazole DR (PROTONIX) tablet 40 mg, 40 mg, Oral, QDAY(21)  polyethylene glycol 3350 (MIRALAX) packet 17 g, 1 packet, Oral, BID  pravastatin (PRAVACHOL) tablet 40 mg, 40 mg, Oral, QDAY  senna (SENOKOT) tablet 1 tablet, 1 tablet, Oral, BID    Continuous Infusions:  PRN and Respiratory Meds:calcium carbonate Q6H PRN, fentaNYL citrate PF Q2H PRN, oxyCODONE Q4H PRN, temazepam QHS PRN      Review of Systems: A 14 point review of systems was negative except for:  Musculoskeletal: positive for R hip pain, RLE posterior pain, R calf pain      Objective:                          Vital Signs: Last Filed                 Vital Signs: 24 Hour Range   BP: 137/62 (08/30 0300)  Temp: 36.9 ???C (98.5 ???F) (08/30 0300)  Pulse: 59 (08/30 0300)  Respirations: 16 PER MINUTE (08/30 0300)  SpO2: 95 % (08/30 0300) BP: (132-159)/(54-83)   Temp:  [36.3 ???C (97.3 ???F)-36.9 ???C (98.5 ???F)]   Pulse:  [55-71]   Respirations:  [16 PER MINUTE-18 PER MINUTE]   SpO2:  [93 %-96 %]    Intensity Pain Scale (Self Report): 6 (10/17/18 0233) Vitals:    10/11/18 2300 10/12/18 1000 10/17/18 0233   Weight: 81.6 kg (180 lb) 81.6 kg (179 lb 14.3 oz) 80.1 kg (176 lb 9.6 oz)       Intake/Output Summary:  (Last 24 hours)    Intake/Output Summary (Last 24 hours) at 10/17/2018 0642  Last data filed at 10/17/2018 0306  Gross per 24 hour   Intake 986 ml   Output ???   Net 986 ml      Stool Occurrence: 0    Physical Exam  General: Alert, cooperative woman in no distress, appears stated age, AOx4  HEENT: Normocephalic, atraumatic, EOMs intact  Pulmonary: Clear to auscultation bilaterally  Cardiovascular: RRR, no murmurs  Abdomen: Soft, non-tender, non-distended, no guarding  Extremities: No cyanosis or edema, equal strength in LE bilaterally. Mild tenderness to palpation of R posterior leg  Neuro: CNII-XII grossly normal. Sensation grossly intact    Lab Review  Comprehensive Metabolic Profile  CMP Latest Ref Rng & Units 10/17/2018 10/16/2018 10/15/2018 10/14/2018 10/13/2018   NA 137 - 147 MMOL/L 131(L) 129(L) 130(L) 130(L) 128(L)   K 3.5 - 5.1 MMOL/L 4.2 4.1 3.9 4.2 4.1   CL 98 - 110 MMOL/L 98 97(L) 97(L) 93(L) 91(L)   CO2 21 - 30 MMOL/L 27 26 24 26 29    GAP 3 - 12 6 6 9 11 8    BUN 7 - 25 MG/DL 17 15 14 14 15    CR 0.4 - 1.00 MG/DL 1.61 0.96 0.45 4.09 8.11   GLUX 70 - 100 MG/DL 914(N) 829(F) 95 621(H) 107(H)   CA 8.5 - 10.6 MG/DL 9.0 9.0 9.2 9.9 9.4 TP 6.0 - 8.0 G/DL 0.8(M) 5.9(L) 6.4 7.4 6.7   ALB 3.5 - 5.0 G/DL 5.7(Q) 3.6 3.7 4.4 3.9   ALKP 25 - 110 U/L 56 51 66 73 67   ALT  7 - 56 U/L 13 13 13 10 9    TBILI 0.3 - 1.2 MG/DL 0.5 0.6 0.7 0.5 0.7   GFR >60 mL/min >60 >60 >60 >60 >60   GFRAA >60 mL/min >60 >60 >60 >60 >60         Point of Care Testing  (Last 24 hours)  Glucose: (!) 101 (10/17/18 0416)    Radiology and other Diagnostics Review:    L Spine AP & Lateral 8/25  Findings/impression:   1. A right convexity lumbar scoliosis measures 16 degrees from L1 to L4.   2. Vertebral body heights appear well-maintained.   3. Grade 1 spondylolisthesis is present at the L4-5 and L5-S1 levels.   There is mild retrolisthesis of L2 on L3. Disc space narrowing is present   at the L2-3 level.     MRI L-Spine WO Contrast 8/26      1. ???At L5-S1: Grade 1 (presumably degenerative) anterolisthesis, marked   degenerative disc disease, marked facet hypertrophy and ligamentum flavum   thickening, and superimposed large right subarticular disc extrusion   (cephalad migrated to the pedicular L5 level) results in severe central   spinal and right lateral recess stenosis and at least moderate bilateral   foraminal stenosis.       2. ???At least moderate left lateral recess stenosis at L1-L2 and L2-L3 and   moderate degenerative trefoil type central spinal stenosis at L4-L5.   Additional levels of less severe degenerative stenosis (as detailed   above).       3. ???Mild right convexity lumbar curvature. Minimal anterolisthesis at   L4-L5 and slight retrolisthesis at L1-L2 and L2-L3.       4. ???Subtle lumbosacral transitional anatomy with first nonrib-bearing   vertebral body designated L1, slight lumbarization-squaring of the S1   vertebral body, and small S1-S2 vestigial disc.       Ellsworth Lennox, DO

## 2018-10-17 NOTE — Progress Notes
Assumed pt care at 1900.  A/Ox4. Tolerating RA. SR c 1* on tele. VSS per trend.  Adequate UOP but inaccurate d/t pt voiding in toilet. Last BM 8/29  Pain tolerated with current regimen. See MAR  No incisions/wounds noted  HFR bundle in place. Call light within reach. Will continue to monitor.

## 2018-10-18 ENCOUNTER — Encounter: Admit: 2018-10-12 | Discharge: 2018-10-12

## 2018-10-18 ENCOUNTER — Encounter: Admit: 2018-10-13 | Discharge: 2018-10-13

## 2018-10-18 ENCOUNTER — Encounter: Admit: 2018-10-18 | Discharge: 2018-10-18 | Primary: Family

## 2018-10-18 ENCOUNTER — Encounter
Admit: 2018-10-12 | Discharge: 2018-10-18 | Disposition: A | Discharge status: Home / Self Care | Source: Other Acute Inpatient Hospital

## 2018-10-18 ENCOUNTER — Ambulatory Visit: Admit: 2018-10-18 | Discharge: 2018-10-18 | Primary: Family

## 2018-10-18 DIAGNOSIS — I10 Essential (primary) hypertension: Secondary | ICD-10-CM

## 2018-10-18 DIAGNOSIS — D72829 Elevated white blood cell count, unspecified: Secondary | ICD-10-CM

## 2018-10-18 DIAGNOSIS — G8929 Other chronic pain: Secondary | ICD-10-CM

## 2018-10-18 DIAGNOSIS — K219 Gastro-esophageal reflux disease without esophagitis: Secondary | ICD-10-CM

## 2018-10-18 DIAGNOSIS — Z66 Do not resuscitate: Secondary | ICD-10-CM

## 2018-10-18 DIAGNOSIS — M48061 Spinal stenosis, lumbar region without neurogenic claudication: Secondary | ICD-10-CM

## 2018-10-18 DIAGNOSIS — M5116 Intervertebral disc disorders with radiculopathy, lumbar region: Secondary | ICD-10-CM

## 2018-10-18 DIAGNOSIS — M25551 Pain in right hip: Secondary | ICD-10-CM

## 2018-10-18 DIAGNOSIS — M4316 Spondylolisthesis, lumbar region: Secondary | ICD-10-CM

## 2018-10-18 DIAGNOSIS — E782 Mixed hyperlipidemia: Secondary | ICD-10-CM

## 2018-10-18 DIAGNOSIS — K5900 Constipation, unspecified: Secondary | ICD-10-CM

## 2018-10-18 DIAGNOSIS — E871 Hypo-osmolality and hyponatremia: Secondary | ICD-10-CM

## 2018-10-18 DIAGNOSIS — Z79899 Other long term (current) drug therapy: Secondary | ICD-10-CM

## 2018-10-18 DIAGNOSIS — Z1159 Encounter for screening for other viral diseases: Secondary | ICD-10-CM

## 2018-10-18 DIAGNOSIS — I2510 Atherosclerotic heart disease of native coronary artery without angina pectoris: Secondary | ICD-10-CM

## 2018-10-18 DIAGNOSIS — Z9861 Coronary angioplasty status: Secondary | ICD-10-CM

## 2018-10-18 DIAGNOSIS — M5137 Other intervertebral disc degeneration, lumbosacral region: Secondary | ICD-10-CM

## 2018-10-18 DIAGNOSIS — M5416 Radiculopathy, lumbar region: Secondary | ICD-10-CM

## 2018-10-18 LAB — CBC AND DIFF: Lab: 5.6 K/UL — ABNORMAL LOW (ref >60)

## 2018-10-18 LAB — COMPREHENSIVE METABOLIC PANEL: Lab: 133 MMOL/L — ABNORMAL LOW (ref 137–147)

## 2018-10-18 LAB — MAGNESIUM: Lab: 1.8 mg/dL — ABNORMAL HIGH (ref >60)

## 2018-10-18 MED ORDER — IOHEXOL 240 MG IODINE/ML IV SOLN
2.5 mL | Freq: Once | EPIDURAL | 0 refills | Status: CP
Start: 2018-10-18 — End: ?
  Administered 2018-10-18: 15:00:00 2.5 mL via EPIDURAL

## 2018-10-18 MED ORDER — OXYCODONE 5 MG PO TAB
5 mg | ORAL_TABLET | ORAL | 0 refills | 6.00000 days | Status: DC | PRN
Start: 2018-10-18 — End: 2018-11-29
  Filled 2018-10-18: qty 14, 3d supply, fill #1

## 2018-10-18 MED ORDER — TRIAMCINOLONE ACETONIDE 40 MG/ML IJ SUSP
80 mg | Freq: Once | EPIDURAL | 0 refills | Status: CP
Start: 2018-10-18 — End: ?
  Administered 2018-10-18: 15:00:00 80 mg via EPIDURAL

## 2018-10-18 MED ORDER — SENNOSIDES 8.6 MG PO TAB
1 | ORAL_TABLET | Freq: Two times a day (BID) | ORAL | 0 refills | Status: CN
Start: 2018-10-18 — End: ?

## 2018-10-18 NOTE — Progress Notes
[facility]  INTERVENTIONAL PAIN PROCEDURE HISTORY AND PHYSICAL    Chief Complaint   Patient presents with   ??? Lower Back - Pain       HISTORY OF PRESENT ILLNESS:  Ms. Goris presents for care regarding back and right leg pain    Medical History:   Diagnosis Date   ??? GERD (gastroesophageal reflux disease)    ??? HTN (hypertension)        No past surgical history on file.    family history includes Aortic Disease/Dissection in her brother and sister; Coronary Artery Disease in her brother, brother, brother, brother, and brother; Hypertension in her brother, brother, brother, brother, brother, sister, sister, and sister.    Social History     Socioeconomic History   ??? Marital status: Single     Spouse name: Not on file   ??? Number of children: Not on file   ??? Years of education: Not on file   ??? Highest education level: Not on file   Occupational History   ??? Not on file   Tobacco Use   ??? Smoking status: Never Smoker   ??? Smokeless tobacco: Never Used   Substance and Sexual Activity   ??? Alcohol use: Yes     Comment: rarely   ??? Drug use: No   ??? Sexual activity: Not on file   Other Topics Concern   ??? Not on file   Social History Narrative   ??? Not on file       Allergies   Allergen Reactions   ??? Amlodipine UNKNOWN   ??? Lisinopril UNKNOWN     I got shaky        Vitals:    10/18/18 0932   PainSc: Eight       REVIEW OF SYSTEMS: 10 point ROS obtained and negative except back and right leg pain      PHYSICAL EXAM:  General: Alert and oriented, very pleasant female.   HEENT showed extraocular muscles were intact and no other abnormalities.  Unlabored breathing.  Regular rate and rhythm on CV exam.   5/5 strength in bilateral upper and lower extremities.    Sensation is intact to light touch and equal in the upper and lower extremities.  There is right lumbar tenderness to palpation         IMPRESSION:    1. Lumbar disc disease with radiculopathy    2. Lumbar radicular pain PLAN: Lumbar Transforaminal Steroid Injection at right L5-S1 and S1-2

## 2018-10-18 NOTE — Progress Notes
PHYSICAL THERAPY  PROGRESS NOTE        Name: Carmen Jones        MRN: [id number]          DOB: 07/15/1934          Age: 84 y.o.  Admission Date: 10/11/2018             LOS: 6 days        Mobility  Progressive Mobility Level: Walk in room  Distance Walked (feet): 60 ft  Level of Assistance: Stand by assistance  Assistive Device: Walker  Time Tolerated: 11-30 minutes    Subjective  Significant hospital events: PMH: CAD s/p PCI in 2013 who presents from [facility] with worsening right sided sciatica pain in lower extremity and concern for unstable angina. CP had resolved, Troponin negative x3, S/p lumbar injection (8/31)    Mental / Cognitive Status: Alert;Oriented;Cooperative  Pain: Patient complains of pain  Pain Location: Right;Leg  Pain Description: Aching  Pain Interventions: Patient agrees to participate in therapy with modifications to session;Patient pre-medicated  Ambulation Assist: Independent Mobility in Community without Device  Patient Owned Equipment: None(was borrowing equipment prior to admit)  Home Situation: Regulatory affairs officer)  Entry Stairs: No Stairs  In-Home Stairs: Elevator    The patient notes pain started on Aug 6 and has been progressively getting worse over the past two weeks. Most severe pain she has experienced - limiting her ability to participate in activities. She is required to walk [facility] meals as [facility] 8/27-Reports calling Sister Darral Dash who reminded the patient that they do have scooters at the [facility] she could potentially use.     Bed Mobility/Transfer  Bed Mobility: Supine to Sit: Standby Assist  Bed Mobility: Sit to Supine: Standby Assist    Transfer Type: Sit to Stand  Transfer: Assistance Level: To/From;Bed;Standby Assist  Transfer: Assistive Device: Nurse, adult    Other Transfer Type: Sit to Stand  Other Transfer: Assistance Level: To/From;Toilet;Standby Assist  Other Transfer: Assistive Device: Surveyor, quantity Distance: 60 feet Gait: Assistance Level: Standby Assist  Gait: Assistive Device: Technical brewer: Descriptors: No balance loss;Normal step length    Assessment/Progress  Assessment/Progress: The patient with continued improvement in ability to tolerate activity. She was able to perform ADLs (bathrooming, dressing, grooming) without assistance. She notes mild discomfort but nothing compared to pain when she came in.      AM-PAC 6 Clicks Basic Mobility Inpatient  Turning from your back to your side while in a flat bed without using bed rails: None  Moving from lying on your back to sitting on the side of a flatbed without using bedrails : None  Moving to and from a bed to a chair (including a wheelchair): None  Standing up from a chair using your arms (e.g. wheelchair, or bedside chair): None  To walk in hospital room: None  Climbing 3-5 steps with a railing: A Little  Raw Score: 23  Standardized (T-scale) Score: 50.88  Basic Mobility CMS 0-100%: 16.55  CMS G Code Modifier for Basic Mobility: CI    Goals  Goal Formulation: With Patient  Patient Will Go Supine To/From Sit: Independently, Met  Patient Will Transfer Sit to Stand: Independently, Met  Patient Will Ambulate: Greater than 200 Feet, Independently, Ongoing    Plan  Plan Frequency: 1-2 Days per Week    PT Discharge Recommendations  Recommendation: Home with intermittent supervision/assistance  Recommendation  for Therapy Post Discharge: Outpatient  Patient Currently Requires Equipment: Walker with wheels    Patient requires the use of a walker with wheels to complete ADL???s in the home including meal preparation, ambulation the bathroom for toileting, bathing and grooming, and safe home mobility.  Patient is unable to complete these ADL???s with a cane or crutch and can safely use the walker.     Therapist: Elvina Sidle, PT, DPT  Date: 10/18/2018

## 2018-10-18 NOTE — Progress Notes
1500 - Reviewed discharge instructions, prescriptions/medications, follow-up appt with pt.  Pt states verbal understanding - no questions.  IV and Tele dc'd.  Pt will pick up prescription at [facility] on her way to the front door.

## 2018-10-18 NOTE — Progress Notes
Assumed care at 1900.  A&Ox4.  VSS per pt trend.  Tolerating RA.  SR/SB w/ 1st Degree AVB on tele.  HR 50's to 60's.  Pain managed via scheduled Tylenol, PRN oxycodone and Fentanyl upon pt request (see MAR).  No skin issues.  Denies N/V.  Last BM 10/17/18.  Inaccurate UOP d/t pt removing hat from toilet.  HFR bundle in place.  No other needs voiced at this time.  Call light within reach.   Assessments completed and documented per flowsheet.

## 2018-10-18 NOTE — Progress Notes
Anesthesia Chronic Pain Consult Note      Admission Date: 10/11/2018                                                LOS: 6 days    Reason for Consult: Patient with worsening right side sciatica and low back pain; MRI L-spine completed which showed severe central stenosis and R lateral recess at L5-S1, etc. Ortho consulted and patient not desiring back surgery at this time.    Consult type: Opinion    Assessment/Plan    Carmen Jones is a 84 y.o. female with a medical history of GERD, HLD, CAD s/p PCI in 2013, HTN.  She presented on 8/24 as a transfer from OSH for worsening right side sciatic pain in addition to chest pain initially concerning for unstable angina.  Chest pain has resolved at this time.  EKG and troponins negative.  Anesthesia chronic pain service was consulted to evaluate for interventions pertaining to right sided sciatic pain.  Orthopedic spine has evaluated patient and there are no recs for surgical interventions at this time as patient does not want to pursue this option.  MRI L spine: At L5-S1: Grade 1 (presumably degenerative) anterolisthesis, marked degenerative disc disease, marked facet hypertrophy and ligamentum flavum thickening, and superimposed large right subarticular disc extrusion (cephalad migrated to the pedicular L5 level) results in severe central spinal and right lateral recess stenosis and at least moderate bilateral foraminal stenosis. ???At least moderate left lateral recess stenosis at L1-L2 and L2-L3 and moderate degenerative trefoil type central spinal stenosis at L4-L5. On physical exam, patient has continued tenderness with palpation near the L5 vertebrae.  Subjective reports of RLE radicular pains posteriorly.  Motor strength intact to bilateral LEs and sensation intact.    Lumbosacral radiculopathy  -plan for right L5-S2 TFESI this AM  -lovenox has been adequately held before injection.  Can restart 12hr post injection -recommend continued participation with PT and OT for overall muscle strengthening and stability  -pt agreeable to plan and verbalized all questions answered at this time        Patient can follow up as an outpatient in the [facility] for ongoing evaluation and treatment by calling [phone]  Thank you for this consult. Please page with questions.    Carmen Millard, APRN-NP  Interventional Pain  Anesthesia Chronic Pain Consult Pager [phone]  ______________________________________________________________________  inerval history 8/31: continued pain throughout the weekend that remained constant and unchanged since initial exam.  Reports slept well overnight.  Eating breakfast this AM in no acute distress.  Looking forward to injection.  Has been working with PT, which she states does help with pain.      History of Present Illness: Carmen Jones is a 84 y.o. female with a medical history of GERD, HLD, CAD s/p PCI in 2013, HTN.  She presented on 8/24 as a transfer from OSH for worsening right side sciatic pain in addition to chest pain initially concerning for unstable angina.  Chest pain has resolved at this time.  EKG and troponins negative.    She describes the pain as follows:  -acute severe pain started 8/7, but has been dealing with this pain for about 25 years.  Onset noted 25 years ago when she was getting into car and felt a pop in her back  -it is localized  to right low back and RLE   -numbness/tingling: none  -initial inciting injury or event: woke up in morning with pain.  Thinks may have slept worong  -pain is intermittent  -the pain is described as sharp  -alleviating factors: PT/OT. Leaning forward   -aggravating factors: prolonged sitting, sitting on hard surfaces  -the pain radiates: Yes to right buttock and down RLE in posterolateral pattern  -VAS 5/10  -loss of bowel or bladder control: No  -unexplained weight loss: No  -experiencing weakness: Yes , RLE    PRIOR MEDICATIONS:   Effective Acetaminophen  NSAID    Ineffective      Unable to tolerate      Never  Gabapentin   Lyrica  Ami/Nortriptyline  Cymbalta  Tizanidine      Past Medical History:  Medical History:   Diagnosis Date   ??? GERD (gastroesophageal reflux disease)    ??? HTN (hypertension)        Family History:  Family History   Problem Relation Age of Onset   ??? Coronary Artery Disease Brother    ??? Hypertension Brother    ??? Hypertension Sister    ??? Hypertension Sister    ??? Aortic Disease/Dissection Sister    ??? Hypertension Sister    ??? Coronary Artery Disease Brother    ??? Hypertension Brother    ??? Coronary Artery Disease Brother    ??? Aortic Disease/Dissection Brother    ??? Hypertension Brother    ??? Coronary Artery Disease Brother    ??? Hypertension Brother    ??? Coronary Artery Disease Brother    ??? Hypertension Brother        Social History:  Lives in [place] [address]    Social History     Socioeconomic History   ??? Marital status: Single     Spouse name: Not on file   ??? Number of children: Not on file   ??? Years of education: Not on file   ??? Highest education level: Not on file   Occupational History   ??? Not on file   Tobacco Use   ??? Smoking status: Never Smoker   ??? Smokeless tobacco: Never Used   Substance and Sexual Activity   ??? Alcohol use: Yes     Comment: rarely   ??? Drug use: No   ??? Sexual activity: Not on file   Other Topics Concern   ??? Not on file   Social History Narrative   ??? Not on file       Allergies:  Allergies   Allergen Reactions   ??? Amlodipine UNKNOWN   ??? Lisinopril UNKNOWN     I got shaky        Medications:    Current Facility-Administered Medications:   ???  acetaminophen (TYLENOL) tablet 1,000 mg, 1,000 mg, Oral, TID, Slimmer, Theodis Sato, MD, 1,000 mg at 10/18/18 0804  ???  aspirin EC tablet 81 mg, 81 mg, Oral, QDAY, Slimmer, Theodis Sato, MD, 81 mg at 10/18/18 0804  ???  atenoloL (TENORMIN) tablet 12.5 mg, 12.5 mg, Oral, QHS, Slimmer, Theodis Sato, MD, 12.5 mg at 10/17/18 2009 ???  calcium carbonate (TUMS) chew tablet 500 mg, 500 mg, Oral, Q6H PRN, Willey Blade, MD, 500 mg at 10/18/18 0000  ???  cholecalciferol (VITAMIN D-3) tablet 1,000 Units, 1,000 Units, Oral, QDAY, Slimmer, Theodis Sato, MD, 1,000 Units at 10/18/18 0804  ???  docusate (COLACE) capsule 200 mg, 200 mg, Oral, QAM8, Slimmer, Theodis Sato, MD, 200 mg  at 10/18/18 0805  ???  emu oil, , Topical, BID, Eddy, John, DO  ???  fentaNYL citrate PF (SUBLIMAZE) injection 25-50 mcg, 25-50 mcg, Intravenous, Q2H PRN, Slimmer, Theodis Sato, MD, 25 mcg at 10/18/18 0400  ???  lidocaine (LIDODERM) 5 % topical patch 1 patch, 1 patch, Topical, QDAY, Stopped at 10/17/18 2012 **AND** Verification of Patch Placement and Integrity - Lidocaine 5%, , Transdermal, BID, Rob Bunting, MD, Stopped at 10/18/18 0805  ???  losartan (COZAAR) tablet 50 mg, 50 mg, Oral, BID, Slimmer, Theodis Sato, MD, 50 mg at 10/18/18 0804  ???  magnesium oxide (MAG-OX) tablet 400 mg, 400 mg, Oral, BID, Slimmer, Theodis Sato, MD, 400 mg at 10/18/18 0804  ???  oxyCODONE (ROXICODONE) tablet 5 mg, 5 mg, Oral, Q4H PRN, Slimmer, Theodis Sato, MD, 5 mg at 10/18/18 0739  ???  pantoprazole DR (PROTONIX) tablet 40 mg, 40 mg, Oral, QDAY(21), Slimmer, Theodis Sato, MD, 40 mg at 10/17/18 2010  ???  polyethylene glycol 3350 (MIRALAX) packet 17 g, 1 packet, Oral, BID, Ellsworth Lennox, DO, 17 g at 10/15/18 1610  ???  pravastatin (PRAVACHOL) tablet 40 mg, 40 mg, Oral, QDAY, Slimmer, Theodis Sato, MD, 40 mg at 10/18/18 0804  ???  senna (SENOKOT) tablet 1 tablet, 1 tablet, Oral, BID, Eddy, John, DO  ???  temazepam (RESTORIL) capsule 15 mg, 15 mg, Oral, QHS PRN, Slimmer, Theodis Sato, MD, 15 mg at 10/17/18 2240    Review of Systems:  Rozell Sammet denies any recent fevers, chills, infection, antibiotics, bowel or bladder incontinence, saddle anesthesia, bleeding issues, or recent anticoagulant.  All 14 systems reviewed and found to be negative except as above and as follows. +recent anticoagulant Vital Signs:  Last Filed in 24 hours Vital Signs:  24 hour Range    BP: 150/64 (08/31 0758)  Temp: 36.2 ???C (97.1 ???F) (08/31 0758)  Pulse: 62 (08/31 0758)  Respirations: 20 PER MINUTE (08/31 0758)  SpO2: 97 % (08/31 0758) BP: (134-172)/(63-93)   Temp:  [36.2 ???C (97.1 ???F)-36.7 ???C (98.1 ???F)]   Pulse:  [56-72]   Respirations:  [16 PER MINUTE-20 PER MINUTE]   SpO2:  [94 %-97 %]      Physical Exam:  General: The patient is a well-developed, well nourished 84 y.o. female in no acute distress.   HEENT: Head is normocephalic and atraumatic. Pupils are equal and reactive to light bilaterally.   Cardiac: Based on palpation, pulse appears to be regular rate and rhythm.   Pulmonary: The patient has unlabored respirations and bilateral symmetric chest excursion.   Abdomen: Soft, nontender, and nondistended. There is no rebound or guarding.   Extremities: No clubbing, cyanosis, or edema.   Neurologic:   The patient is alert and oriented times 3.   Cranial nerves II through XII intact  MS: moves all extremities equally and spontaneously     Lumbar spine:  Appearance: No lesions or deformity  Lumbar tenderness: Yes near L5 and right side paraspinals  SI joint tenderness: Right  Pain with extension: Yes  Pain with lateral flexion: Bilateral  Lower extremity strength: 5/5 bilaterally  Sensation to light touch: Intact and equal in the bilateral lower extremities  Straight leg raise positive?: None  Reflexes:  2/4 in bilateral patellar and achilles tendons      Results for orders placed during the hospital encounter of 10/11/18   MRI L-SPINE WO CONTRAST    Narrative EXAM: MRI L-SPINE     HISTORY: Low back pain,    Technique: Multiple  sagittal and axial MR sequences were obtained of the lumbar spine.    Comparison: L-spine radiographs October 12, 2018.    FINDINGS:    There is transitional vertebral anatomy. First nonrib-bearing vertebral body is designated L1. There is some partial squaring and lumbarization of S1 with a vestigial disc at S1-S2.    Mild rightward convexity. Grade 1 (presumably degenerative) anterolisthesis at L5-S1. Additional minimal anterolisthesis of anterolisthesis at L4-L5 and slight retrolisthesis at L1-L2 and L2-L3. The vertebral body heights are maintained. Few scattered benign intraosseous hemangiomas. Degenerative marrow endplate changes at T11-T12 and L5-S1.  The conus is normal in position and appearance at the L1 level. Bilateral articular facet effusions and tiny posterior paraspinous synovial cysts at L5-S1. There is Baastrup disease with degeneration of the spinous processes and at least moderate multilevel ligamentum flavum thickening throughout the lumbar spine. Fatty atrophy of the posterior paraspinous musculature.    T12-L1: Marked degenerative disc disease and circumferential disc bulge. No significant central spinal or neuroforaminal stenosis.  L1-L2: Minimal retrolisthesis, moderate sized broad-based left subarticular/foraminal and extraforaminal disc herniation and facet hypertrophy (left greater than right). Results in mild eccentric left central spinal stenosis, at least moderate left lateral recess stenosis and moderate left-sided foraminal stenosis.  L2-L3: Minimal retrolisthesis, degenerative disc disease (eccentric to the left), broad-based left-sided disc osteophyte complex, ligament of flavum thickening and facet hypertrophy. Results in mild to moderate central spinal stenosis (eccentric to the left) and at least moderate left and mild right lateral recess stenosis and moderate left neural foraminal stenosis.Marland Kitchen  L3-L4: Circumferential disc bulge, facet hypertrophy and ligament flavum thickening results in mild to moderate trefoil type central spinal stenosis and mild bilateral foraminal stenosis.  L4-L5: Slight anterolisthesis, circumferential disc bulge, facet hypertrophy and ligamentum flavum thickening results in moderate trefoil type central spinal stenosis and mild bilateral foraminal stenosis.  L5-S1: Marked degenerative disc disease, grade 1 anterolisthesis, facet hypertrophy and ligamentum flavum thickening as well as and a large superimposed right subarticular disc extrusion, cephalad migrated to the pedicular L5 level. This results in severe central spinal and right lateral recess stenosis and moderate to severe bilateral foraminal stenosis.        Impression 1.  At L5-S1: Grade 1 (presumably degenerative) anterolisthesis, marked degenerative disc disease, marked facet hypertrophy and ligamentum flavum thickening, and superimposed large right subarticular disc extrusion (cephalad migrated to the pedicular L5 level) results in severe central spinal and right lateral recess stenosis and at least moderate bilateral foraminal stenosis.  2.  At least moderate left lateral recess stenosis at L1-L2 and L2-L3 and moderate degenerative trefoil type central spinal stenosis at L4-L5. Additional levels of less severe degenerative stenosis (as detailed above).  3.  Mild right convexity lumbar curvature. Minimal anterolisthesis at L4-L5 and slight retrolisthesis at L1-L2 and L2-L3.  4.  Subtle lumbosacral transitional anatomy with first nonrib-bearing vertebral body designated L1, slight lumbarization-squaring of the S1 vertebral body, and small S1-S2 vestigial disc.    By my electronic signature, I attest that I have personally reviewed the images for this examination and formulated the interpretations and opinions expressed in this report       Finalized by Desma Maxim, M.D. on 10/14/2018 9:34 AM. Dictated by Karmen Stabs, MD on 10/14/2018 8:07 AM.         Lab/Radiology/Other Diagnostic Tests:  Hematology:    Lab Results   Component Value Date    HGB 13.5 10/18/2018    HCT 40.0 10/18/2018    PLTCT 322 10/18/2018  WBC 5.6 10/18/2018    NEUT 58 10/18/2018    ANC 3.28 10/18/2018    ALC 1.53 10/18/2018    MONA 10 10/18/2018 AMC 0.54 10/18/2018    ABC 0.10 10/18/2018    MCV 89.1 10/18/2018    MCHC 33.9 10/18/2018    MPV 7.7 10/18/2018    RDW 14.4 10/18/2018   , Coagulation:    Lab Results   Component Value Date    PTT 28.4 11/06/2011    INR 1.8 06/30/2012   , General Chemistry:    Lab Results   Component Value Date    NA 133 10/18/2018    K 3.9 10/18/2018    CL 99 10/18/2018    GAP 6 10/18/2018    BUN 15 10/18/2018    CR 0.74 10/18/2018    GLU 107 10/18/2018    CA 9.6 10/18/2018    ALBUMIN 3.9 10/18/2018    LACTIC 1.0 11/07/2011    MG 1.8 10/18/2018    TOTBILI 0.5 10/18/2018    and Enzymes:    Lab Results   Component Value Date    AST 18 10/18/2018    ALT 15 10/18/2018    ALKPHOS 64 10/18/2018

## 2018-10-18 NOTE — Patient Instructions
Procedure Completed Today: Lumbar Transforaminal Steroid Injection    Important information following your procedure today: You may drive today    1. Pain relief may not be immediate. It is possible you may even experience an increase in pain during the first 24-48 hours followed by a gradual decrease of your pain.  2. Though the procedure is generally safe and complications are rare, we do ask that you be aware of any of the following:   ? Any swelling, persistent redness, new bleeding, or drainage from the site of the injection.  ? You should not experience a severe headache.  ? You should not run a fever over 101? F.  ? New onset of sharp, severe back & or neck pain.  ? New onset of upper or lower extremity numbness or weakness.  ? New difficulty controlling bowel or bladder function after the injection.  ? New shortness of breath.    If any of these occur, please call to report this occurrence to a nurse at [phone]. If you are calling after 4:00 p.m., on weekends or holidays please call [phone] and ask to have the resident physician on call for the physician paged or go to your local emergency room.  3. You may experience soreness at the injection site. Ice can be applied at 20 minute intervals. Avoid application of direct heat, hot showers or hot tubs today.  4. Avoid strenuous activity today. You may resume your regular activities and exercise tomorrow.  5. Patients with diabetes may see an elevation in blood sugars for 7-10 days after the injection. It is important to pay close attention to your diet, check your blood sugars daily and report extreme elevations to the physician that treats your diabetes.  6. Patients taking a daily blood thinner can resume their regular dose this evening.  7. It is important that you take all medications ordered by your pain physician. Taking medication as ordered is an important part of your pain care plan. If you cannot continue the medication plan, please notify the physician.     Possible side effects to steroids that may occur:  ? Flushing or redness of the face  ? Irritability  ? Fluid retention  ? Change in women?s menses    The following medications were used: Lidocaine , Triamcinolone   and Contrast Dye

## 2018-10-18 NOTE — Case Management (ED)
Case Management Progress Note    NAME:Carmen Jones                          MRN: [id number]              DOB:August 22, 1934          AGE: 84 y.o.  ADMISSION DATE: 10/11/2018             DAYS ADMITTED: LOS: 6 days      Todays Date: 10/18/2018    Discharge Plan:Pt d/c home today     Interventions  ? Support   Support: Pt/Family Updates re:POC or DC Plan  ? Info or Referral      ? Discharge Planning        -Med 3 team anticipates pt d/c today   -PT recommends Outpatient therapy and Walker with wheels   -Spoke to pt, she declined walker at this time. She states can borrow one if needed for now.   -Updated Med 3 team to provide script for outpatient therapy.  ? Medication Needs                                                 ? Financial      ? Legal      ? Other        Disposition  ? Expected Discharge Date    Expected Discharge Date: 10/18/18  Expected Discharge Time: 8657  ? Transportation   Does the patient need discharge transport arranged?: No  Transportation Name, Phone and Availability #1: One of the [place] sisters from the [place]  Does the patient use Medicaid Transportation?: No  ? Next Level of Care (Acute Psych discharges only)      ? Discharge Disposition                                          Durable Medical Equipment      No service has been selected for the patient.      [place] [facility]      No service has been selected for the patient.      [place]      No service has been selected for the patient.      [facility]      No service has been selected for the patient.      Dante Gang  Nurse Case Manager  Cell: [phone]  [phone]

## 2018-10-18 NOTE — Progress Notes
OCCUPATIONAL THERAPY  PROGRESS NOTE      Name: Carmen Jones        MRN: [id number]          DOB: 12/02/1934          Age: 84 y.o.  Admission Date: 10/11/2018             LOS: 6 days      Mobility  Patient Turn/Position: Weight shifted (Chair)  Progressive Mobility Level: Walk in hallway  Distance Walked (feet): 120 ft  Level of Assistance: Stand by assistance  Assistive Device: Walker  Time Tolerated: 11-30 minutes  Activity Limited By: Pain    Subjective  Pertinent Dx per Physician: h/o GERD, HLD, CAD s/p PCI in 2013, and HTN who presents as a transfer from [facility] with worsening right sided sciatica pain in lower extremity and concern for unstable angina. s/p right L5-S2 steroid injection 8/31   Precautions: Falls  Pain / Complaints: Patient agrees to participate in therapy  Pain Location: Right;Hip;Thigh;Leg  Pain Level Current: (2/10 at rest, 4/10 with activity)    Objective  Psychosocial Status: Willing and Cooperative to Participate    Home Living  Type of Home: ([facility])  Home Layout: Performs ADL'S on One Level;Elevator(completes 3 flights of steps to room but elevator available )  Financial risk analyst / Tub: Pension scheme manager: Standard  Bathroom Equipment: Writer in [employer] Around Technical sales engineer Accessibility: Accessible via [employer] Equipment: Dan Humphreys  Comment: Pt has access to necessary DME at [employer] - was borrowing walker for a few days before admission.    Prior Function  Level Of Independence: Independent with ADLs and functional transfers  Receives Help From: None Needed  Other Function Comments: Pt reports she is typically very independent up until the last few days where walking became difficult. She has to walk approximately 100 feet to access communal bathroom and dining area.    Vision  Current Vision: Wears Glasses All of the Time    ADL's  Where Assessed: Edge of Bed  LE Dressing Assist: Modified Independent LE Dressing Deficits: Don/Doff R Sock;Don/Doff L Sock    ADL Mobility  Bed Mobility: Supine to Sit: Standby assist  Bed Mobility Comments: bed flat  Transfer Type: Sit to/from stand  Transfer: Assistance Level: To/from;Bed;Standby assist  Transfer: Assistive Device: Agricultural consultant: Type of Assistance: For safety considerations  End of Activity Status: Sitting at edge of bed;Instructed patient to request assist with mobility;Instructed patient to use call light;Nursing notified  Gait Distance: 120 feet  Gait: Assistance Level: Standby assist  Gait: Assistive Device: Roller walker  Gait Comments: Pt with increase in pain about halfway through ambulation; however, pt states the distance she completed would be the distance she needs to do to use the communal restroom at [employer]    Activity Tolerance  Endurance: 2/5 Tolerates 10-20 Minutes Exercise w/Multiple Rests    Cognition  Overall Cognitive Status: WFL to Adequately Complete Self Care Tasks Safely  Attention: Awake/Alert    Education  Goal Formulation: With Patient    Assessment  Assessment: Decreased ADL Status;Decreased Endurance;Decreased Self-Care Trans;Decreased High-Level ADLs  Prognosis: Good  Goal Formulation: Patient  Comments: Patient continues to be limited by pain. Pt aware of need for rest breaks.     AM-PAC 6 Clicks Daily Activity Inpatient  Putting on and taking off regular lower body clothes?: None  Bathing (Including washing, rinsing, drying): A Little  Toileting, which includes  using toilet, bedpan, or urinal: None  Putting on and taking off regular upper body clothing: None  Taking care of personal grooming such as brushing teeth: None  Eating meals?: None  Daily Activity Raw Score: 23  Standardized (t-scale) score: 51.12  CMS 0-100% Score: 15.86  CMS G Code Modifier: CI    Plan  OT Frequency: 3-5x/week  OT Plan for Next Visit: progress activity tolerance in standing, grooming standing at sink    ADL Goals Patient Will Perform All ADL's: w/ Modified Independence    Functional Transfer Goals  Pt Will Perform All Functional Transfers: w/ Stand By Assist    Arm Goals  Pt  Will Complete Theraband Exer: B UE, 1 Set, 10 Reps, Level 2 Theraband, w/ Good Activity Tolerance    OT Discharge Recommendations  Recommendation: Home with intermittent supervision/assistance  Patient Currently Requires Physical Assist With: All home functioning ADLs  Patient Currently Requires Equipment: Walker with wheels     Patient requires the use of a walker with wheels to complete ADL???s in the home including meal preparation, ambulation the bathroom for toileting, bathing and grooming, and safe home mobility.  Patient is unable to complete these ADL???s with a cane or crutch and can safely use the walker.     Therapist: Mariana Single, OTR/L [phone]  Date: 10/18/2018

## 2018-10-18 NOTE — Progress Notes

## 2018-10-18 NOTE — Progress Notes
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   ???  ???  RLE Hip/Sciatica Pain  - Chronic low back pain from injury 25 years ago; right hip pain worsened starting 8/6 with acute worsening the last few days  - Xrays at OSH per nurse at OSH and pt were negative for acute pathology  - L-spine 8/25: grade 1 spondylolisthesis at L4-5 and L5-S1 levels, mild retrolisthesis of L2 on L3, disc space narrowing present at L2-3 level  - MRI L-spine 8/26: severe central spinal and right lateral recess stenosis and at least moderate bilateral foraminal stenosis, at least moderate left lateral recess stenosis at L1-L2 and L2-L3 and moderate degenerative trefoil type central spinal stenosis at L4-L5  - Ortho consulted; patient does not want surgery, can pursue nonoperative management w/ PT/OT, steroid injections  Plan  > Pain control with lidocaine patch, oxycodone, fentanyl, scheduled Tylenol, emu oil  > CTM for alarm symptoms (urinary/fecal incontinence, saddle anesthesia, motor dysfunction)  > Pain anesthesia consulted:  L4-S1 TFESI taking place on 8/31   ???  HTN  - PTA felodipine held 2/2 not being on formulary; plan to resume on discharge  - HCTZ d/c'd in setting of hyponatremia; plan to hold on discharge  Plan  > Continue Losartan, Atenolol  > Continue to monitor  ???  Hypomagnesemia, Hyponatremia  - Mg sulfate 4g given 8/25 - Na 127, serum osmolality 267 on admission  - 8/28: Na 130  Plan  > Mag-ox 400mg  BID  > Encourage fluid intake   > Continue to monitor  ???  Constipation  - miralax BID, docusate  Plan  > senna BID     Chest pain, likely 2/2 reflux (resolved)  - Pt reported chest pain at OSH which prompted transfer to [place] 2/2 concern for unstable angina  - EKG at OSH without signs of ischemia. Troponins negative x2  - Pain resolved with belching prior to admission to [facility]; hx of GERD  - Repeat EKG at [place] with NSR, no ST changes. Troponin negative  Plan  > Continue PTA PPI  > Continue to monitor  ???  FEN: Cardiac diet, no IVF  PPx: Lovenox  Code Status: DNAR-FI    Patient seen and discussed with Dr. Archie Balboa.  Marcelline Deist, MD  Internal Medicine, PGY-1  ________________________________________________________________________    Subjective  Fernando Torry is a 84 y.o. female.  Today, she is resting comfortably in bed. She is awaiting her procedure with pain-anesthesia later this morning.     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 Patient reported chest pain at OSH which prompted transfer to [place] 2/2 concern for unstable angina. Her EKG at OSH showed no signs of ischemia. Troponins were negative x2. Her painresolved with belching prior to admission to [place]; hx of GERD. Follow up EKG at [facility] with NSR, no ST changes and repeat Troponin was also negative.    She also has chronic low back pain from injury 25 years ago. Her pain has been worsening acutely the few days prior to admission along with worsening right hip pain. Xrays at OSH per nurse at OSH.  MRI L-spine 10/13/2018 showed severe central spinal and right lateral recess stenosis and at least moderate bilateral foraminal stenosis, at least moderate left lateral recess stenosis at L1-L2 and L2-L3 and moderate degenerative trefoil type central spinal stenosis at L4-L5. Ortho was consulted; patient does not want surgery    Review of Systems   Constitution: Negative for chills and fever.   HENT: Negative for congestion and sore throat.    Eyes: Negative for blurred vision and double vision.   Cardiovascular: Negative for chest pain and palpitations.   Respiratory: Negative for cough and shortness of breath.    Skin: Negative for dry skin, rash and suspicious lesions.   Musculoskeletal: Negative for muscle weakness and myalgias.   Gastrointestinal: Negative for abdominal pain, constipation, diarrhea, nausea and vomiting.   Genitourinary: Negative for dysuria, frequency and urgency.   Neurological: Negative for dizziness and headaches.          Medications  Scheduled Meds:acetaminophen (TYLENOL) tablet 1,000 mg, 1,000 mg, Oral, TID  aspirin EC tablet 81 mg, 81 mg, Oral, QDAY  atenoloL (TENORMIN) tablet 12.5 mg, 12.5 mg, Oral, QHS  cholecalciferol (VITAMIN D-3) tablet 1,000 Units, 1,000 Units, Oral, QDAY  docusate (COLACE) capsule 200 mg, 200 mg, Oral, QAM8  emu oil, , Topical, BID  lidocaine (LIDODERM) 5 % topical patch 1 patch, 1 patch, Topical, QDAY    And  Verification of Patch Placement and Integrity - Lidocaine 5%, , Transdermal, BID  losartan (COZAAR) tablet 50 mg, 50 mg, Oral, BID  magnesium oxide (MAG-OX) tablet 400 mg, 400 mg, Oral, BID  pantoprazole DR (PROTONIX) tablet 40 mg, 40 mg, Oral, QDAY(21)  polyethylene glycol 3350 (MIRALAX) packet 17 g, 1 packet, Oral, BID  pravastatin (PRAVACHOL) tablet 40 mg, 40 mg, Oral, QDAY  senna (SENOKOT) tablet 1 tablet, 1 tablet, Oral, BID    Continuous Infusions:  PRN and Respiratory Meds:calcium carbonate Q6H PRN, fentaNYL citrate PF Q2H PRN, oxyCODONE Q4H PRN, temazepam QHS PRN      Objective                       Vital Signs: Last Filed                 Vital Signs: 24 Hour Range BP: 155/93 (08/31 1232)  Temp: 36.5 ???C (97.7 ???F) (08/31 1103)  Pulse: 58 (08/31 1103)  Respirations: 18 PER MINUTE (08/31 1103)  SpO2: 97 % (08/31 1103)  SpO2 Pulse: 64 (08/31 1022) BP: (134-178)/(63-93)   Temp:  [36.2 ???C (97.1 ???F)-36.7 ???C (98.1 ???F)]   Pulse:  [56-72]   Respirations:  [16 PER MINUTE-20 PER MINUTE]   SpO2:  [94 %-97 %]    Intensity Pain Scale (Self Report): 6 (10/18/18 0800) Vitals:    10/11/18 2300 10/12/18 1000 10/17/18 0233   Weight: 81.6 kg (180 lb) 81.6 kg (179 lb 14.3 oz)  80.1 kg (176 lb 9.6 oz)       Intake/Output Summary:  (Last 24 hours)    Intake/Output Summary (Last 24 hours) at 10/18/2018 1242  Last data filed at 10/18/2018 1044  Gross per 24 hour   Intake 1862 ml   Output 0 ml   Net 1862 ml      Stool Occurrence: 1    Physical Exam  Constitutional:       General: She is not in acute distress.     Appearance: Normal appearance.   HENT:      Head: Normocephalic and atraumatic.      Nose: No congestion or rhinorrhea.   Neck:      Musculoskeletal: Normal range of motion.   Cardiovascular:      Rate and Rhythm: Normal rate and regular rhythm.      Heart sounds: No murmur. No friction rub. No gallop.    Pulmonary:      Effort: Pulmonary effort is normal.      Breath sounds: Normal breath sounds. No wheezing.   Abdominal:      General: Abdomen is flat. Bowel sounds are normal. There is no distension.      Palpations: Abdomen is soft.      Tenderness: There is no abdominal tenderness.   Musculoskeletal: Normal range of motion.   Skin:     General: Skin is warm and dry.   Neurological:      General: No focal deficit present.      Mental Status: She is alert.   Psychiatric:         Mood and Affect: Mood normal.         Behavior: Behavior normal.       Lab Review  24-hour labs:    Results for orders placed or performed during the hospital encounter of 10/11/18 (from the past 24 hour(s))   CBC AND DIFF    Collection Time: 10/18/18  4:01 AM   Result Value Ref Range White Blood Cells 5.6 4.5 - 11.0 K/UL    RBC 4.49 4.0 - 5.0 M/UL    Hemoglobin 13.5 12.0 - 15.0 GM/DL    Hematocrit 16.1 36 - 45 %    MCV 89.1 80 - 100 FL    MCH 30.2 26 - 34 PG    MCHC 33.9 32.0 - 36.0 G/DL    RDW 09.6 11 - 15 %    Platelet Count 322 150 - 400 K/UL    MPV 7.7 7 - 11 FL    Neutrophils 58 41 - 77 %    Lymphocytes 27 24 - 44 %    Monocytes 10 4 - 12 %    Eosinophils 3 0 - 5 %    Basophils 2 0 - 2 %    Absolute Neutrophil Count 3.28 1.8 - 7.0 K/UL    Absolute Lymph Count 1.53 1.0 - 4.8 K/UL    Absolute Monocyte Count 0.54 0 - 0.80 K/UL    Absolute Eosinophil Count 0.19 0 - 0.45 K/UL    Absolute Basophil Count 0.10 0 - 0.20 K/UL   COMPREHENSIVE METABOLIC PANEL    Collection Time: 10/18/18  4:01 AM   Result Value Ref Range    Sodium 133 (L) 137 - 147 MMOL/L    Potassium 3.9 3.5 - 5.1 MMOL/L    Chloride 99 98 - 110 MMOL/L    Glucose 107 (H) 70 - 100 MG/DL    Blood Urea Nitrogen 15 7 -  25 MG/DL    Creatinine 4.54 0.4 - 1.00 MG/DL    Calcium 9.6 8.5 - 09.8 MG/DL    Total Protein 6.5 6.0 - 8.0 G/DL    Total Bilirubin 0.5 0.3 - 1.2 MG/DL    Albumin 3.9 3.5 - 5.0 G/DL    Alk Phosphatase 64 25 - 110 U/L    AST (SGOT) 18 7 - 40 U/L    CO2 28 21 - 30 MMOL/L    ALT (SGPT) 15 7 - 56 U/L    Anion Gap 6 3 - 12    eGFR Non African American >60 >60 mL/min    eGFR African American >60 >60 mL/min   MAGNESIUM    Collection Time: 10/18/18  4:01 AM   Result Value Ref Range    Magnesium 1.8 1.6 - 2.6 mg/dL       Point of Care Testing  (Last 24 hours)  Glucose: (!) 107 (10/18/18 0401)    Radiology and other Diagnostics Review:    Pertinent radiology reviewed.    Marcelline Deist, MD

## 2018-10-19 ENCOUNTER — Encounter: Admit: 2018-10-19 | Discharge: 2018-10-19 | Primary: Family

## 2018-10-19 NOTE — Procedures
Attending Surgeon: Clovis Cao, MD    Anesthesia: Local    Pre-Procedure Diagnosis:   1. Lumbar disc disease with radiculopathy    2. Lumbar radicular pain        Post-Procedure Diagnosis:   1. Lumbar disc disease with radiculopathy    2. Lumbar radicular pain        SNR/TF LMBR/SAC  Procedure: transforaminal epidural    Laterality: right    Location: Lumbar/Sacral - L5-S1 and S1-2      Consent:   Consent obtained: written  Consent given by: patient  Risks discussed: allergic reaction, bleeding, infection, weakness and no change or worsening in pain  Alternatives discussed: alternative treatment     Universal Protocol:  Relevant documents: relevant documents present and verified  Test results: test results available and properly labeled  Imaging studies: imaging studies available  Required items: required blood products, implants, devices, and special equipment available  Site marked: the operative site was marked  Patient identity confirmed: Patient identify confirmed verbally with patient.        Time out: Immediately prior to procedure a "time out" was called to verify the correct patient, procedure, equipment, support staff and site/side marked as required      Procedures Details:   Indications: pain   Prep: chlorhexidine  Patient position: prone  Estimated Blood Loss: minimal  Specimens: none  Number of Levels: 2  Approach: right paramedian  Guidance: fluoroscopy  Needle size: 25 G  Injection procedure: Negative aspiration for blood  Patient tolerance: Patient tolerated the procedure well with no immediate complications. Pressure was applied, and hemostasis was accomplished.  Outcome: Pain improved  Comments: Kenalog 40 mg was injected at each location        Estimated blood loss: none or minimal  Specimens: none  Patient tolerated the procedure well with no immediate complications. Pressure was applied, and hemostasis was accomplished.

## 2018-11-05 ENCOUNTER — Encounter: Admit: 2018-11-05 | Discharge: 2018-11-05 | Payer: MEDICARE | Primary: Family

## 2018-11-05 NOTE — Telephone Encounter
Pt called and states that when she was discharged from [facility] on 8/31, some of her medications had been changed.  They stopped her HCTZ due to hyponatremia, and instructed her to take felodipine in the morning.  Previously she has been taking that medication in the evening.  She states that she does have a little swelling in her ankles since stopping the HCTZ, but she is not overly concerned with that.  She is wanting to know if she can go back to taking the felodipine in the evening as she as before as she felt she had better control of her blood pressure.  She states that her BP goes up at night, and last night she woke up at 2:30am and checked her BP and it was 162/81.  She denies any headache, vision changes or chest pain.  Pt will go back to taking felodipine in the evening.  She will continue to monitor her BP and will call us back in one week with readings.  She has follow up scheduled with Dr. Tollie Pizza on 10/12.

## 2018-11-29 ENCOUNTER — Encounter: Admit: 2018-11-29 | Discharge: 2018-11-29 | Payer: MEDICARE | Primary: Family

## 2018-11-29 DIAGNOSIS — I10 Essential (primary) hypertension: Secondary | ICD-10-CM

## 2018-11-29 DIAGNOSIS — E782 Mixed hyperlipidemia: Secondary | ICD-10-CM

## 2018-11-29 DIAGNOSIS — E871 Hypo-osmolality and hyponatremia: Secondary | ICD-10-CM

## 2018-11-29 DIAGNOSIS — I214 Non-ST elevation (NSTEMI) myocardial infarction: Secondary | ICD-10-CM

## 2018-11-29 DIAGNOSIS — I2510 Atherosclerotic heart disease of native coronary artery without angina pectoris: Secondary | ICD-10-CM

## 2018-11-29 DIAGNOSIS — K219 Gastro-esophageal reflux disease without esophagitis: Secondary | ICD-10-CM

## 2018-11-29 MED ORDER — FUROSEMIDE 20 MG PO TAB
20 mg | ORAL_TABLET | Freq: Every morning | ORAL | 3 refills | 90.00000 days | Status: DC
Start: 2018-11-29 — End: 2019-02-23

## 2018-11-29 NOTE — Progress Notes
Date of Service: 11/29/2018    Carmen Jones is a 84 y.o. female.       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 ?    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  Fortunately she was hospitalized at [facility] in August for back pain.  She was found be hyponatremic with sodium of 127.  For the last several years she is been mildly hyponatremic running between 132 and 135 but this was felt to be excessive and her hydrochlorothiazide was discontinued.  Unfortunate this resulted in emergency room blood pressure.  Since discharge to hospital she is consistently running between 148 and 165 systolic over 85-90 diastolic.  This has her quite anxious.  She has been in part of her nights sitting up sooner blood sugars be lower compared with laying down.  She has not had symptoms with this.  She has not had chest pain, angina, syncope, near syncope or any specific cardiovascular symptoms.  She has not had any back or flank pain.  She simply has high blood pressure which has her worried.  She also is noticed to gain weight and pedal edema.  Her blood pressure control is previously excellent on losartan 50 mg twice daily felodipine 5 mg daily atenolol 12.5 mg daily and hydrochlorothiazide.  It appears that her blood pressure is somewhat volume dependent.  I suspect the spironolactone and Hydrocort thiazide will again cause worsening kalemia.  Going to try a furosemide low dose.  Also given a fluid to control her blood pressure without causing hyponatremia or volume depletion.  Will need to watch her very carefully.  See her back in the office in 1 month.    She has no other complaints.  She does have reflux from time to time but this has not changed its pattern.  She is not had dizziness, lightheadedness, syncope or near syncope.  She was not symptomatic with the hyponatremia.    Examination today is recorded below.    Impression  1.  Hypertension is not well controlled after stopping her diuretic.  Going to restart furosemide 20 mg daily and follow her very closely.  This appears to be volume dependent hypertension in her case  2.  Coronary disease with intervention 2013 for unstable angina.  No recurrence of angina chest pain.  Last stress test 2018 was negative.  3.  Hypercholesterolemia is treated  4.  Chronic low back pain  5.  GERD.  No symptoms currently    Plan  Start furosemide 20 mg daily.  We will see her back in 1 month with a BMP.  She was not ever hypokalemic on hydrochlorothiazide the past I am not going to start a potassium supplement will recheck her labs.         Vitals:    11/29/18 1344 11/29/18 1355 11/29/18 1356   BP: (!) 148/82 (!) 152/80 (!) 162/89   BP Source: Arm, Left Upper Arm, Right Upper Arm, Left Upper   Pulse: 80  78   Temp: 36.8 ?C (98.3 ?F)     SpO2: 98%     Weight: 78.5 kg (173 lb)     Height: 1.676 m (5' 6)     PainSc: Zero       Body mass index is  27.92 kg/m?Marland Kitchen     Past Medical History  Patient Active Problem List    Diagnosis Date Noted   ? Hyponatremia 10/12/2018   ? Hypomagnesemia 10/12/2018   ? Chest pain 09/25/2016   ? 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 Mixed hyperlipidemia 11/15/2013   ? Arteriosclerotic coronary artery disease 12/01/2011     10/2011  Chest pain.  transfer form [facility] with NSTEMI          Angiogram: LAD mid 95%, LCX-OMB1 40%, dom RCA 95%.     PCI with 3.0x18 Xience DES in LAD and 3.0x15 Xience DES in RCA  08/14 regadenoson thall:  EF 87%, no ischemia, normal scan  08/18 Atypical chest pain, regaden thall: EF 90%, no ischemia      ? NSTEMI (non-ST elevated myocardial infarction) (HCC) 11/06/2011   ? Essential hypertension 11/06/2011 ? GERD (gastroesophageal reflux disease) 11/06/2011         Review of Systems   Constitution: Negative.   HENT: Negative.    Eyes: Negative.    Cardiovascular: Positive for leg swelling.   Respiratory: Negative.    Endocrine: Negative.    Hematologic/Lymphatic: Negative.    Skin: Negative.    Musculoskeletal: Negative.    Gastrointestinal: Negative.    Genitourinary: Negative.    Neurological: Negative.    Psychiatric/Behavioral: Negative.    Allergic/Immunologic: Negative.        Physical Exam   General Appearance:?no acute distress   Skin:?warm, moist, no ulcers   HEENT:?unremarkable   Neck Veins:? neck veins are not distended   Carotid Arteries:?normal carotid upstroke bilaterally, no bruits   Chest Inspection:?chest is normal in appearance   Auscultation/Percussion:?lungs clear to auscultation, no rales, rhonchi, or wheezing   Cardiac Rhythm:?regular rhythm and normal rate   Cardiac Auscultation:?Normal S1 &?S2, no S3 or S4, no rub   Murmurs:?no cardiac murmurs   Extremities:?no lower extremity edema; 2+ symmetric distal pulses   Abdominal Exam:?soft, non-tender, no masses, bowel sounds normal   Liver & Spleen:?no organomegaly   Neurologic Exam:?neurological assessment grossly intact      Current Medications (including today's revisions)  ? acetaminophen (TYLENOL) 500 mg tablet Take 500 mg by mouth as Needed for Pain. Max of 4,000 mg of acetaminophen in 24 hours.   ? aspirin EC 81 mg tablet Take 81 mg by mouth daily.   ? atenoloL (TENORMIN) 25 mg tablet Take one-half tablet by mouth at bedtime daily.   ? cholecalciferol (Vitamin D3) (VITAMIN D-3) 1,000 units tablet Take 1,000 Units by mouth daily.   ? docusate (COLACE) 100 mg capsule Take 200 mg by mouth every morning.   ? esomeprazole DR(+) (NEXIUM) 40 mg capsule Take 40 mg by mouth every morning. Take on an empty stomach at least 1 hour before or 2 hours after food.   ? felodipine (PLENDIL) 5 mg ER tablet TAKE 1 TABLET BY MOUTH DAILY. TAKE ON AN EMPTY STOMACH. (Patient taking differently: Take 5 mg by mouth at bedtime daily. Take on an empty stomach.)   ? losartan (COZAAR) 50 mg tablet TAKE 1 TABLET BY MOUTH TWICE A DAY   ? magnesium oxide (MAG-OX) 400 mg tablet Take 400 mg by mouth twice daily.   ? naproxen sodium (ALEVE) 220 mg tablet Take  by mouth as Needed. Taking 1 tablet twice daily   ? oxyCODONE (ROXICODONE) 5 mg tablet Take one tablet by mouth every 4 hours as needed   ? pravastatin (PRAVACHOL) 40 mg tablet TAKE 1 TABLET BY MOUTH  EVERY DAY (Patient taking differently: Take 40 mg by mouth at bedtime daily.)   ? senna (SENOKOT) 8.6 mg tablet Take one tablet by mouth twice daily.   ? soy isofla/blk cohosh/mag bark (ESTROVEN PO) Take  by mouth.   ? temazepam (RESTORIL) 15 mg capsule Take 15 mg by mouth at bedtime as needed.

## 2018-11-29 NOTE — Patient Instructions
Start furosemide (Lasix) 20mg  once a day       Office visit in one month with blood work.

## 2018-12-02 ENCOUNTER — Ambulatory Visit: Admit: 2018-12-02 | Discharge: 2018-12-02 | Payer: MEDICARE | Primary: Family

## 2018-12-02 ENCOUNTER — Encounter: Admit: 2018-12-02 | Discharge: 2018-12-02 | Payer: MEDICARE | Primary: Family

## 2018-12-02 DIAGNOSIS — M5416 Radiculopathy, lumbar region: Secondary | ICD-10-CM

## 2018-12-02 DIAGNOSIS — K219 Gastro-esophageal reflux disease without esophagitis: Secondary | ICD-10-CM

## 2018-12-02 DIAGNOSIS — I10 Essential (primary) hypertension: Secondary | ICD-10-CM

## 2018-12-02 DIAGNOSIS — M5116 Intervertebral disc disorders with radiculopathy, lumbar region: Secondary | ICD-10-CM

## 2018-12-02 NOTE — Progress Notes
[facility] CLINIC NOTE       SUBJECTIVE: Carmen Jones presents in follow-up for ongoing care regarding back and lower extremity pain.  Pain is right-sided and has resolved since undergoing a lumbar transforaminal epidural steroid injection in July.  She is able to tolerate increased activities at this time.         Review of Systems    Current Outpatient Medications:   ?  acetaminophen (TYLENOL) 500 mg tablet, Take 500 mg by mouth as Needed for Pain. Max of 4,000 mg of acetaminophen in 24 hours., Disp: , Rfl:   ?  aspirin EC 81 mg tablet, Take 81 mg by mouth daily., Disp: , Rfl:   ?  atenoloL (TENORMIN) 25 mg tablet, Take one-half tablet by mouth at bedtime daily., Disp: 45 tablet, Rfl: 3  ?  cholecalciferol (Vitamin D3) (VITAMIN D-3) 1,000 units tablet, Take 1,000 Units by mouth daily., Disp: , Rfl:   ?  docusate (COLACE) 100 mg capsule, Take 200 mg by mouth every morning., Disp: , Rfl:   ?  esomeprazole DR(+) (NEXIUM) 40 mg capsule, Take 40 mg by mouth every morning. Take on an empty stomach at least 1 hour before or 2 hours after food., Disp: , Rfl:   ?  felodipine (PLENDIL) 5 mg ER tablet, TAKE 1 TABLET BY MOUTH DAILY. TAKE ON AN EMPTY STOMACH. (Patient taking differently: Take 5 mg by mouth at bedtime daily. Take on an empty stomach.), Disp: 90 tablet, Rfl: 3  ?  furosemide (LASIX) 20 mg tablet, Take one tablet by mouth every morning., Disp: 90 tablet, Rfl: 3  ?  losartan (COZAAR) 50 mg tablet, TAKE 1 TABLET BY MOUTH TWICE A DAY, Disp: 180 tablet, Rfl: 3  ?  magnesium oxide (MAG-OX) 400 mg tablet, Take 400 mg by mouth twice daily., Disp: , Rfl:   ?  naproxen sodium (ALEVE) 220 mg tablet, Take  by mouth as Needed. Taking 1 tablet twice daily, Disp: , Rfl:   ?  pravastatin (PRAVACHOL) 40 mg tablet, TAKE 1 TABLET BY MOUTH EVERY DAY (Patient taking differently: Take 40 mg by mouth at bedtime daily.), Disp: 90 tablet, Rfl: 3  ?  senna (SENOKOT) 8.6 mg tablet, Take one tablet by mouth twice daily., Disp: 90 tablet, Rfl: 0  ?  soy isofla/blk cohosh/mag bark (ESTROVEN PO), Take  by mouth., Disp: , Rfl:   ?  temazepam (RESTORIL) 15 mg capsule, Take 15 mg by mouth at bedtime as needed., Disp: , Rfl:   Allergies   Allergen Reactions   ? Amlodipine UNKNOWN   ? Lisinopril UNKNOWN     I got shaky      Physical Exam  Vitals:    12/02/18 1119 12/02/18 1125   BP: (!) 166/96 (!) 174/97   BP Source: Arm, Left Upper Arm, Right Upper   Patient Position: Sitting Sitting   Pulse: 65    Resp: 16    Temp: 36.7 ?C (98 ?F)    TempSrc: Oral    SpO2: 99%    Weight: 78.5 kg (173 lb)    Height: 167.6 cm (66)    PainSc: Three         Pain Score: Three  Body mass index is 27.92 kg/m?Marland Kitchen  General: Alert and oriented, very pleasant female.   HEENT showed extraocular muscles were intact and no other abnormalities.  Unlabored breathing.  Regular rate and rhythm on CV exam.   5/5 strength in bilateral upper and lower extremities.  Sensation is intact to light touch and equal in the upper and lower extremities.  For low back is nontender to palpation well       IMPRESSION:  1. Lumbar disc disease with radiculopathy    2. Lumbar radicular pain          PLAN: Continue with home exercise plan as prescribed by physical therapy and follow-up will be as needed.

## 2018-12-28 ENCOUNTER — Encounter: Admit: 2018-12-28 | Discharge: 2018-12-28 | Payer: MEDICARE | Primary: Family

## 2018-12-28 DIAGNOSIS — M5416 Radiculopathy, lumbar region: Secondary | ICD-10-CM

## 2018-12-28 DIAGNOSIS — M5116 Intervertebral disc disorders with radiculopathy, lumbar region: Secondary | ICD-10-CM

## 2018-12-28 NOTE — Telephone Encounter
Patient called asking for a repeat injection. Pain is the same as it was before, down right leg.   Patient was taking Tylenol and Alieve but pain has worsened and she is now taking hydrocodone.     Patient states that she had 65% relief of pain lasting 6 weeks from right TFESI on 10/18/2018. Patient had been going to PT.     Patient was scheduled for a repeat injection.

## 2019-01-05 ENCOUNTER — Encounter: Admit: 2019-01-05 | Discharge: 2019-01-05 | Payer: MEDICARE | Primary: Family

## 2019-01-05 DIAGNOSIS — I2510 Atherosclerotic heart disease of native coronary artery without angina pectoris: Secondary | ICD-10-CM

## 2019-01-05 DIAGNOSIS — I214 Non-ST elevation (NSTEMI) myocardial infarction: Secondary | ICD-10-CM

## 2019-01-05 DIAGNOSIS — E782 Mixed hyperlipidemia: Secondary | ICD-10-CM

## 2019-01-05 DIAGNOSIS — I10 Essential (primary) hypertension: Secondary | ICD-10-CM

## 2019-01-05 DIAGNOSIS — E871 Hypo-osmolality and hyponatremia: Secondary | ICD-10-CM

## 2019-01-05 LAB — BASIC METABOLIC PANEL
Lab: 0.7
Lab: 105
Lab: 135 K/UL — ABNORMAL LOW (ref 136–145)
Lab: 16 K/UL (ref 0–0.20)
Lab: 28 10*3/uL (ref 0–0.45)
Lab: 4.2 10*3/uL (ref 1.0–4.8)
Lab: 9.7
Lab: 97 10*3/uL — ABNORMAL LOW (ref 98–107)

## 2019-01-06 ENCOUNTER — Encounter: Admit: 2019-01-06 | Discharge: 2019-01-06 | Payer: MEDICARE | Primary: Family

## 2019-01-06 NOTE — Telephone Encounter
-----   Message from Roena Malady, MD sent at 01/05/2019 10:40 PM CST -----  happy  ----- Message -----  From: Asencion Noble  Sent: 01/05/2019   2:48 PM CST  To: Roena Malady, MD    Bmp after start of lasix k looks good

## 2019-01-07 ENCOUNTER — Encounter: Admit: 2019-01-07 | Discharge: 2019-01-07 | Payer: MEDICARE | Primary: Family

## 2019-01-07 DIAGNOSIS — M5116 Intervertebral disc disorders with radiculopathy, lumbar region: Secondary | ICD-10-CM

## 2019-01-10 ENCOUNTER — Encounter: Admit: 2019-01-10 | Discharge: 2019-01-10 | Payer: MEDICARE | Primary: Family

## 2019-01-10 ENCOUNTER — Ambulatory Visit: Admit: 2019-01-10 | Discharge: 2019-01-10 | Payer: MEDICARE | Primary: Family

## 2019-01-10 DIAGNOSIS — M5116 Intervertebral disc disorders with radiculopathy, lumbar region: Secondary | ICD-10-CM

## 2019-01-10 DIAGNOSIS — M5416 Radiculopathy, lumbar region: Secondary | ICD-10-CM

## 2019-01-10 DIAGNOSIS — I10 Essential (primary) hypertension: Secondary | ICD-10-CM

## 2019-01-10 DIAGNOSIS — K219 Gastro-esophageal reflux disease without esophagitis: Secondary | ICD-10-CM

## 2019-01-10 MED ORDER — IOHEXOL 240 MG IODINE/ML IV SOLN
2.5 mL | Freq: Once | EPIDURAL | 0 refills | Status: CP
Start: 2019-01-10 — End: ?
  Administered 2019-01-10: 14:00:00 2.5 mL via EPIDURAL

## 2019-01-10 MED ORDER — TRIAMCINOLONE ACETONIDE 40 MG/ML IJ SUSP
80 mg | Freq: Once | EPIDURAL | 0 refills | Status: CP
Start: 2019-01-10 — End: ?
  Administered 2019-01-10: 14:00:00 80 mg via EPIDURAL

## 2019-01-10 NOTE — Patient Instructions
Procedure Completed Today: Lumbar Transforaminal Steroid Injection    Important information following your procedure today: You may drive today    1. Pain relief may not be immediate. It is possible you may even experience an increase in pain during the first 24-48 hours followed by a gradual decrease of your pain.  2. Though the procedure is generally safe and complications are rare, we do ask that you be aware of any of the following:   ? Any swelling, persistent redness, new bleeding, or drainage from the site of the injection.  ? You should not experience a severe headache.  ? You should not run a fever over 101? F.  ? New onset of sharp, severe back & or neck pain.  ? New onset of upper or lower extremity numbness or weakness.  ? New difficulty controlling bowel or bladder function after the injection.  ? New shortness of breath.    If any of these occur, please call to report this occurrence to a nurse at [phone]. If you are calling after 4:00 p.m., on weekends or holidays please call [phone] and ask to have the resident physician on call for the physician paged or go to your local emergency room.  3. You may experience soreness at the injection site. Ice can be applied at 20 minute intervals. Avoid application of direct heat, hot showers or hot tubs today.  4. Avoid strenuous activity today. You may resume your regular activities and exercise tomorrow.  5. Patients with diabetes may see an elevation in blood sugars for 7-10 days after the injection. It is important to pay close attention to your diet, check your blood sugars daily and report extreme elevations to the physician that treats your diabetes.  6. Patients taking a daily blood thinner can resume their regular dose this evening. 7. It is important that you take all medications ordered by your pain physician. Taking medication as ordered is an important part of your pain care plan. If you cannot continue the medication plan, please notify the physician.     Possible side effects to steroids that may occur:  ? Flushing or redness of the face  ? Irritability  ? Fluid retention  ? Change in women?s menses    The following medications were used: Triamcinolone   and Contrast Dye

## 2019-01-10 NOTE — Progress Notes
[facility]  INTERVENTIONAL PAIN PROCEDURE HISTORY AND PHYSICAL    Chief Complaint   Patient presents with   ? Procedure       HISTORY OF PRESENT ILLNESS:  Carmen Jones presents in  Follow up for care regarding back and right leg pain    Medical History:   Diagnosis Date   ? GERD (gastroesophageal reflux disease)    ? HTN (hypertension)        No past surgical history on file.    family history includes Aortic Disease/Dissection in her brother and sister; Coronary Artery Disease in her brother, brother, brother, brother, and brother; Hypertension in her brother, brother, brother, brother, brother, sister, sister, and sister.    Social History     Socioeconomic History   ? Marital status: Single     Spouse name: Not on file   ? Number of children: Not on file   ? Years of education: Not on file   ? Highest education level: Not on file   Occupational History   ? Not on file   Tobacco Use   ? Smoking status: Never Smoker   ? Smokeless tobacco: Never Used   Substance and Sexual Activity   ? Alcohol use: Yes     Comment: rarely   ? Drug use: No   ? Sexual activity: Not on file   Other Topics Concern   ? Not on file   Social History Narrative   ? Not on file       Allergies   Allergen Reactions   ? Amlodipine UNKNOWN   ? Lisinopril UNKNOWN     I got shaky        Vitals:    01/10/19 0722   BP: (!) 160/77   Pulse: 73   Resp: 17   Temp: 36.2 ?C (97.2 ?F)   SpO2: 97%   Weight: 77.6 kg (171 lb)   Height: 165.1 cm (65)   PainSc: Seven       REVIEW OF SYSTEMS: 10 point ROS obtained and negative except back and right leg pain      PHYSICAL EXAM:  General: Alert and oriented, very pleasant female.   HEENT showed extraocular muscles were intact and no other abnormalities.  Unlabored breathing.  Regular rate and rhythm on CV exam.   5/5 strength in bilateral upper and lower extremities.    Sensation is intact to light touch and equal in the upper and lower extremities.        IMPRESSION: 1. Lumbar disc disease with radiculopathy    2. Lumbar radicular pain         PLAN: Lumbar Transforaminal Steroid Injection at right L5-S1 and S1-2

## 2019-01-10 NOTE — Progress Notes

## 2019-01-10 NOTE — Procedures
Attending Surgeon: Clovis Cao, MD    Anesthesia: Local    Pre-Procedure Diagnosis:   1. Lumbar disc disease with radiculopathy    2. Lumbar radicular pain        Post-Procedure Diagnosis:   1. Lumbar disc disease with radiculopathy    2. Lumbar radicular pain        SNR/TF LMBR/SAC  Procedure: transforaminal epidural    Laterality: right    Location: Lumbar/Sacral - L5-S1 and S1-2      Consent:   Consent obtained: written  Consent given by: patient  Risks discussed: allergic reaction, bleeding, infection, weakness and no change or worsening in pain  Alternatives discussed: alternative treatment     Universal Protocol:  Relevant documents: relevant documents present and verified  Test results: test results available and properly labeled  Imaging studies: imaging studies available  Required items: required blood products, implants, devices, and special equipment available  Site marked: the operative site was marked  Patient identity confirmed: Patient identify confirmed verbally with patient.        Time out: Immediately prior to procedure a "time out" was called to verify the correct patient, procedure, equipment, support staff and site/side marked as required      Procedures Details:   Indications: pain   Prep: chlorhexidine  Patient position: prone  Estimated Blood Loss: minimal  Specimens: none  Number of Levels: 2  Approach: right paramedian  Guidance: fluoroscopy  Needle size: 25 G  Injection procedure: Negative aspiration for blood  Patient tolerance: Patient tolerated the procedure well with no immediate complications. Pressure was applied, and hemostasis was accomplished.  Outcome: Pain improved  Comments: Kenalog 40 mg was injected at each location        Estimated blood loss: none or minimal  Specimens: none  Patient tolerated the procedure well with no immediate complications. Pressure was applied, and hemostasis was accomplished.

## 2019-02-22 ENCOUNTER — Encounter: Admit: 2019-02-22 | Discharge: 2019-02-22 | Payer: MEDICARE | Primary: Family

## 2019-02-23 ENCOUNTER — Encounter

## 2019-02-23 DIAGNOSIS — I214 Non-ST elevation (NSTEMI) myocardial infarction: Secondary | ICD-10-CM

## 2019-02-23 DIAGNOSIS — E782 Mixed hyperlipidemia: Secondary | ICD-10-CM

## 2019-02-23 DIAGNOSIS — I2510 Atherosclerotic heart disease of native coronary artery without angina pectoris: Secondary | ICD-10-CM

## 2019-02-23 DIAGNOSIS — I10 Essential (primary) hypertension: Secondary | ICD-10-CM

## 2019-02-23 DIAGNOSIS — K219 Gastro-esophageal reflux disease without esophagitis: Secondary | ICD-10-CM

## 2019-02-23 MED ORDER — POTASSIUM CHLORIDE 10 MEQ PO TBER
10 meq | ORAL_TABLET | Freq: Every day | ORAL | 3 refills | 30.00000 days | Status: AC
Start: 2019-02-23 — End: ?

## 2019-02-23 MED ORDER — FUROSEMIDE 40 MG PO TAB
40 mg | ORAL_TABLET | Freq: Every morning | ORAL | 3 refills | 90.00000 days | Status: AC
Start: 2019-02-23 — End: ?

## 2019-02-23 NOTE — Patient Instructions
Increase furosemide to 40mg  daily     Start potasium tablet - one a day

## 2019-02-23 NOTE — Progress Notes
Date of Service: 02/23/2019    Carmen Jones is a 85 y.o. female.       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 ?She had a hospitalization August 2020 for back pain and was found to be hyponatremic secondary to hydrochlorothiazide/spironolactone combination.  Of the drug she was hypertensive and we had to place her on furosemide and add felodipine for blood pressure control.    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  Her blood pressure is better but still not fully controlled.  At her home and sometimes gets up to 150 systolic.  Also has fairly significant edema on her lower extremities.  Sure this is a combination of stopping her spironolactone and adding a calcium channel blocker to her regimen.  We are going to increase her furosemide and follow her blood pressure and edema.  Since increasing the furosemide to 40 mg a rate and potassium 10 mEq daily as well.    She is having no symptoms.  She has no chest pain, exertional chest pain, shortness of breath or exertional shortness of breath.  She does have reflux which scheduled chest discomfort and epigastric discomfort at times.  Today she ate too much chili too fast because she was rushing to get here for an office visit and had some epigastric discomfort which finally resolved in the parking lot.  I do not think this is angina.  He had no recurrence of cardiac symptoms and her risk factors are well controlled.    Examination significant only for 1+ edema in the lower extremities.    Impression  1.  Hypertension is not fully controlled.  Will increase furosemide to 40 mg daily and add potassium.  Need to check a BMP in 2 months. 2.  Dependent edema secondary to felodipine and a decrease in her diuretics.  Will increase furosemide dose  3.  Coronary disease with interventions thousand 19 for unstable angina.  No angina at this time.  Last stress test was negative.  4.  Hypercholesterolemia is treated  5.  Chronic low back pain.  She still having some difficulty and has to use a walker.  She does think she is getting better.  6.  Gastroesophageal reflux disease    Plan  Will increase furosemide to 40 mg daily and add potassium 10 mEq.  We will check electrolytes in 2 months.  I will see her back in the spring.  ?    This note was dictated using Dragon instant transcription system.  Please pardon any inadvertent errors.       Vitals:    02/23/19 1422 02/23/19 1423   BP: (!) 142/84 (!) 140/80   BP Source: Arm, Left Upper Arm, Right Upper   Patient Position: Sitting    Pulse: 81    Temp: 36.6 ?C (97.8 ?F)    TempSrc: Oral    SpO2: 98%    Weight: 80.3 kg (177 lb)    Height: 1.676 m (5' 6)    PainSc: Zero      Body mass index is 28.57 kg/m?Marland Kitchen     Past Medical History  Patient Active Problem List    Diagnosis Date Noted   ? Hyponatremia 10/12/2018   ? Hypomagnesemia 10/12/2018   ? Chest pain 09/25/2016   ?  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 Mixed hyperlipidemia 11/15/2013   ? Arteriosclerotic coronary artery disease 12/01/2011     10/2011  Chest pain.  transfer form [facility] with NSTEMI          Angiogram: LAD mid 95%, LCX-OMB1 40%, dom RCA 95%.     PCI with 3.0x18 Xience DES in LAD and 3.0x15 Xience DES in RCA  08/14 regadenoson thall:  EF 87%, no ischemia, normal scan  08/18 Atypical chest pain, regaden thall: EF 90%, no ischemia      ? NSTEMI (non-ST elevated myocardial infarction) (HCC) 11/06/2011   ? Essential hypertension 11/06/2011   ? GERD (gastroesophageal reflux disease) 11/06/2011         Review of Systems   Constitution: Negative.   HENT: Negative.    Eyes: Negative.    Cardiovascular: Positive for leg swelling. Respiratory: Negative.    Endocrine: Negative.    Hematologic/Lymphatic: Negative.    Skin: Negative.    Musculoskeletal: Negative.    Gastrointestinal: Negative.    Genitourinary: Negative.    Neurological: Negative.    Psychiatric/Behavioral: Negative.    Allergic/Immunologic: Negative.        Physical Exam  General Appearance:?no acute distress   Skin:?warm, moist, no ulcers   HEENT:?unremarkable   Neck Veins:? neck veins are not distended   Carotid Arteries:?normal carotid upstroke bilaterally, no bruits   Chest Inspection:?chest is normal in appearance   Auscultation/Percussion:?lungs clear to auscultation, no rales, rhonchi, or wheezing   Cardiac Rhythm:?regular rhythm and normal rate   Cardiac Auscultation:?Normal S1 &?S2, no S3 or S4, no rub   Murmurs:?no cardiac murmurs   Extremities:?no lower extremity edema; 2+ symmetric distal pulses   Abdominal Exam:?soft, non-tender, no masses, bowel sounds normal   Liver & Spleen:?no organomegaly   Neurologic Exam:?neurological assessment grossly intact  ?    Current Medications (including today's revisions)  ? acetaminophen (TYLENOL) 500 mg tablet Take 500 mg by mouth as Needed for Pain. Max of 4,000 mg of acetaminophen in 24 hours.   ? aspirin EC 81 mg tablet Take 81 mg by mouth daily.   ? atenoloL (TENORMIN) 25 mg tablet Take one-half tablet by mouth at bedtime daily.   ? cholecalciferol (Vitamin D3) (VITAMIN D-3) 1,000 units tablet Take 1,000 Units by mouth daily.   ? docusate (COLACE) 100 mg capsule Take 200 mg by mouth every morning.   ? esomeprazole DR(+) (NEXIUM) 40 mg capsule Take 40 mg by mouth every morning. Take on an empty stomach at least 1 hour before or 2 hours after food.   ? felodipine (PLENDIL) 5 mg ER tablet TAKE 1 TABLET BY MOUTH DAILY. TAKE ON AN EMPTY STOMACH. (Patient taking differently: Take 5 mg by mouth at bedtime daily. Take on an empty stomach.)   ? furosemide (LASIX) 40 mg tablet Take one tablet by mouth every morning. ? losartan (COZAAR) 50 mg tablet TAKE 1 TABLET BY MOUTH TWICE A DAY   ? magnesium oxide (MAG-OX) 400 mg tablet Take 400 mg by mouth twice daily.   ? naproxen sodium (ALEVE) 220 mg tablet Take  by mouth as Needed. Taking 1 tablet twice daily   ? potassium chloride (KLOR-CON) 10 mEq tablet Take one tablet by mouth daily. Take with a meal and a full glass of water.   ? pravastatin (PRAVACHOL) 40 mg tablet TAKE 1 TABLET BY MOUTH EVERY DAY (Patient taking differently: Take 40 mg by mouth at bedtime daily.)   ? senna (SENOKOT) 8.6 mg  tablet Take one tablet by mouth twice daily.   ? soy isofla/blk cohosh/mag bark (ESTROVEN PO) Take  by mouth.   ? temazepam (RESTORIL) 15 mg capsule Take 15 mg by mouth at bedtime as needed.

## 2019-04-12 ENCOUNTER — Encounter: Admit: 2019-04-12 | Discharge: 2019-04-12 | Payer: MEDICARE | Primary: Family

## 2019-04-12 DIAGNOSIS — M5116 Intervertebral disc disorders with radiculopathy, lumbar region: Secondary | ICD-10-CM

## 2019-04-13 ENCOUNTER — Encounter: Admit: 2019-04-13 | Discharge: 2019-04-13 | Payer: MEDICARE | Primary: Family

## 2019-04-13 ENCOUNTER — Ambulatory Visit: Admit: 2019-04-13 | Discharge: 2019-04-13 | Payer: MEDICARE | Primary: Family

## 2019-04-13 DIAGNOSIS — M5416 Radiculopathy, lumbar region: Secondary | ICD-10-CM

## 2019-04-13 DIAGNOSIS — M5116 Intervertebral disc disorders with radiculopathy, lumbar region: Secondary | ICD-10-CM

## 2019-04-13 MED ORDER — TRIAMCINOLONE ACETONIDE 40 MG/ML IJ SUSP
80 mg | Freq: Once | EPIDURAL | 0 refills | Status: CP
Start: 2019-04-13 — End: ?
  Administered 2019-04-13: 17:00:00 80 mg via EPIDURAL

## 2019-04-13 MED ORDER — IOPAMIDOL 41 % IT SOLN
2.5 mL | Freq: Once | EPIDURAL | 0 refills | Status: CP
Start: 2019-04-13 — End: ?
  Administered 2019-04-13: 17:00:00 2.5 mL via EPIDURAL

## 2019-04-14 ENCOUNTER — Encounter: Admit: 2019-04-14 | Discharge: 2019-04-14 | Payer: MEDICARE | Primary: Family

## 2019-06-10 IMAGING — US UER
1 series · 13 of 13 positions shown · non-contrast
Comparison: none

[Series 1: us (person_name) ext right · 13 of 13 slices shown]
[im 1/13]
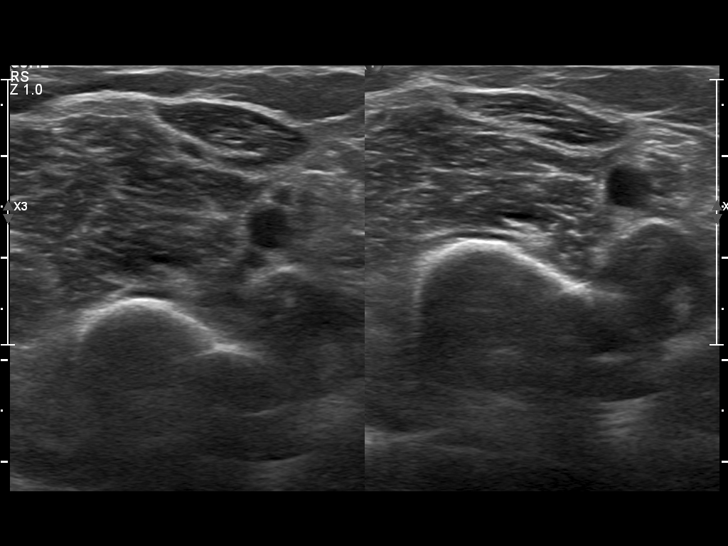
[im 2/13]
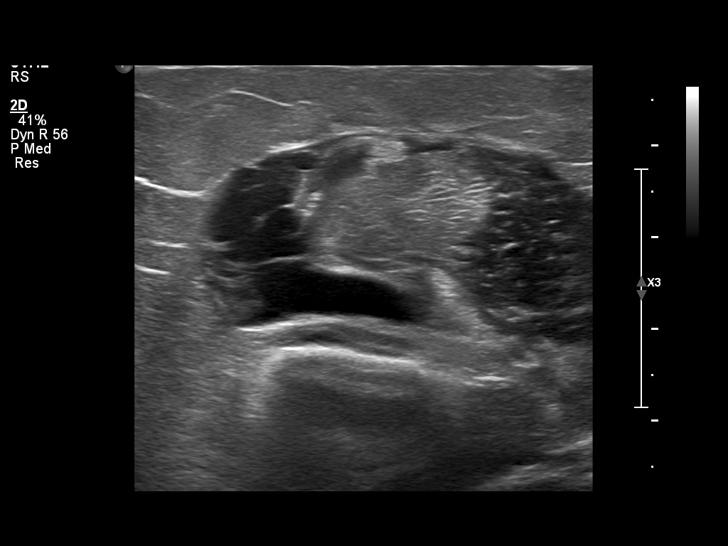
[im 3/13]
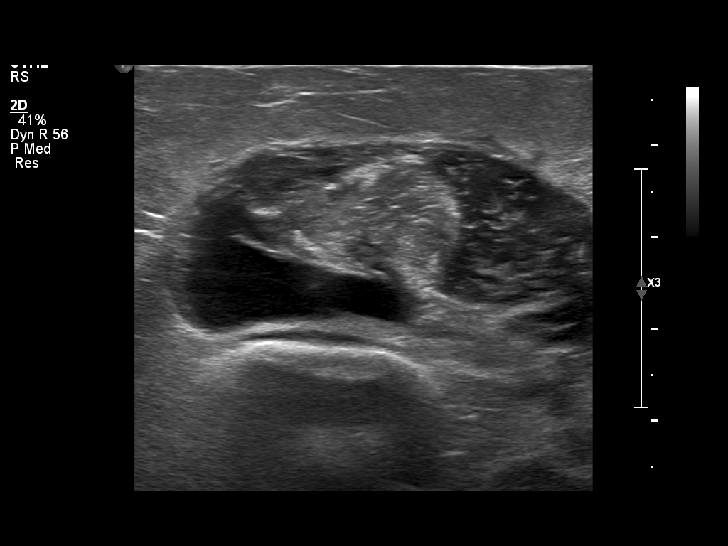
[im 4/13]
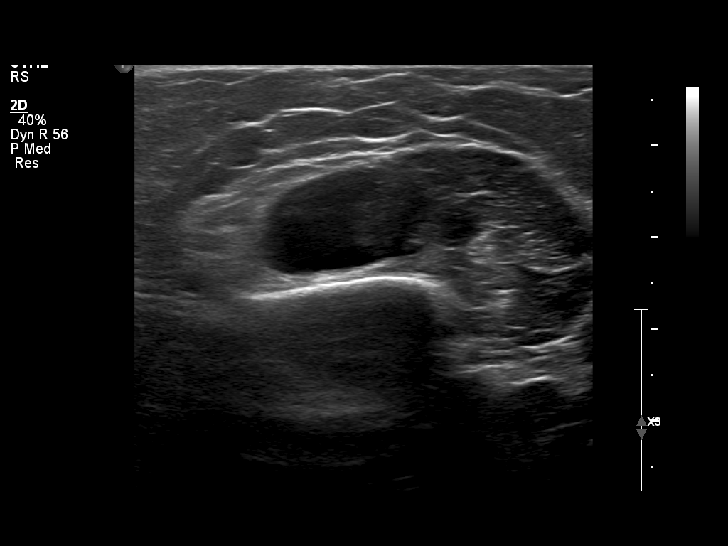
[im 5/13]
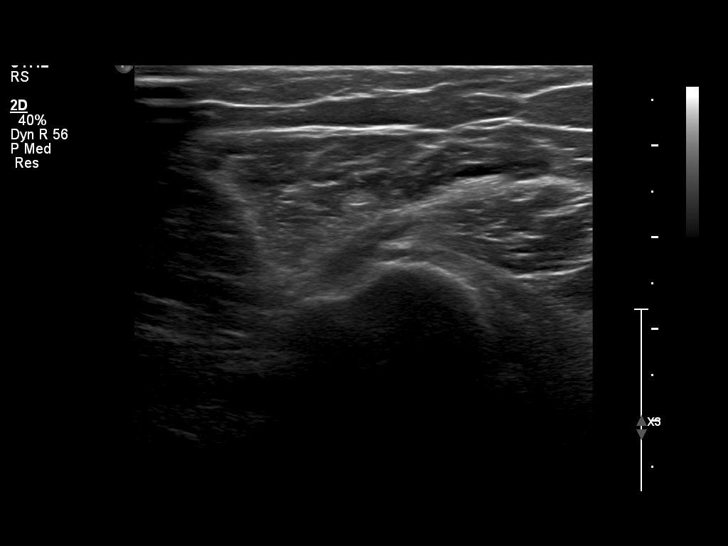
[im 6/13]
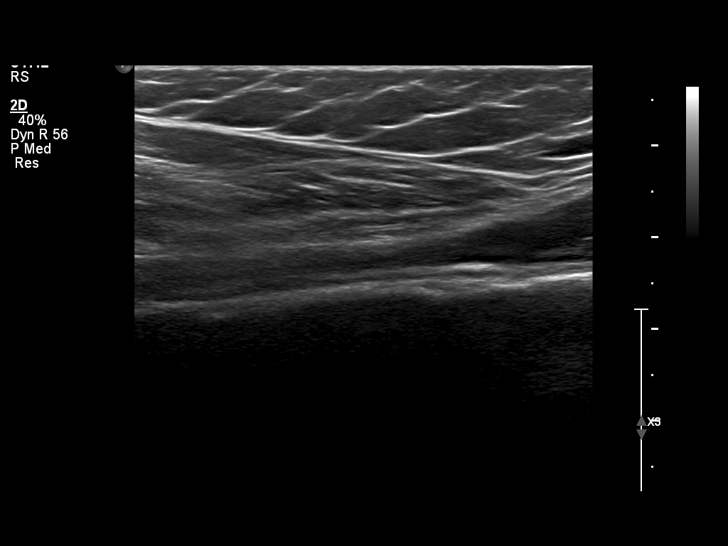
[im 7/13]
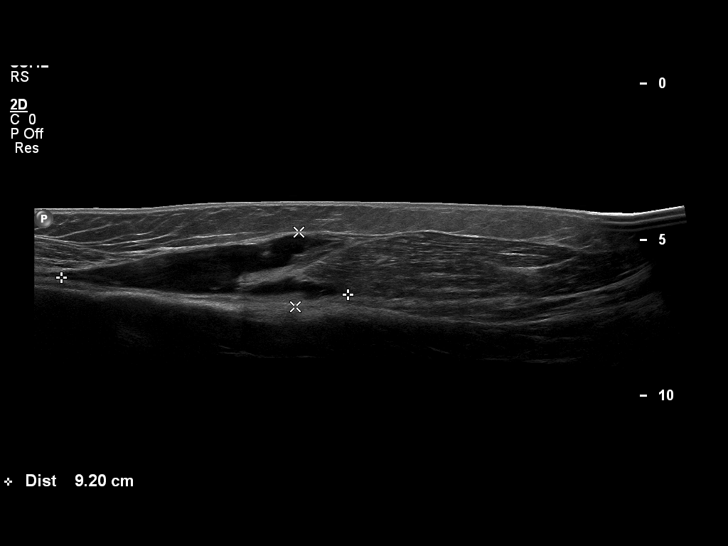
[im 8/13]
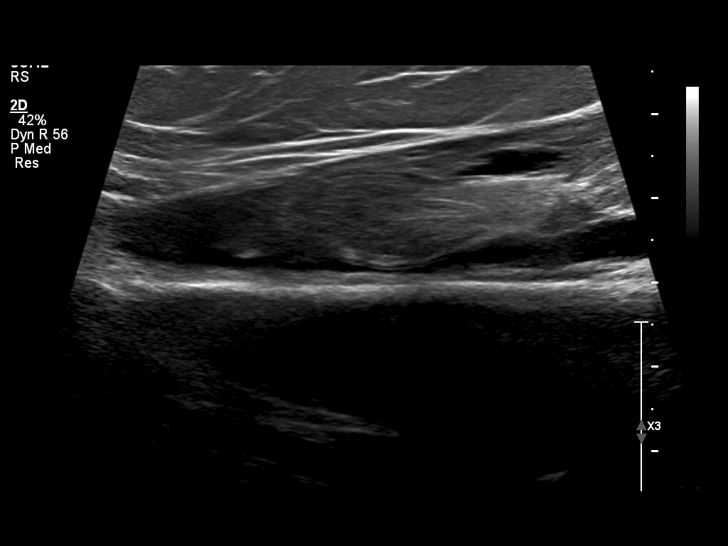
[im 9/13]
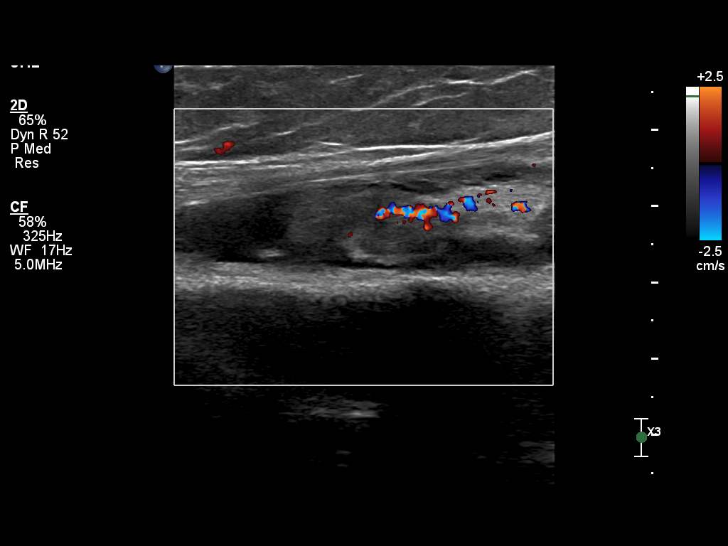
[im 10/13]
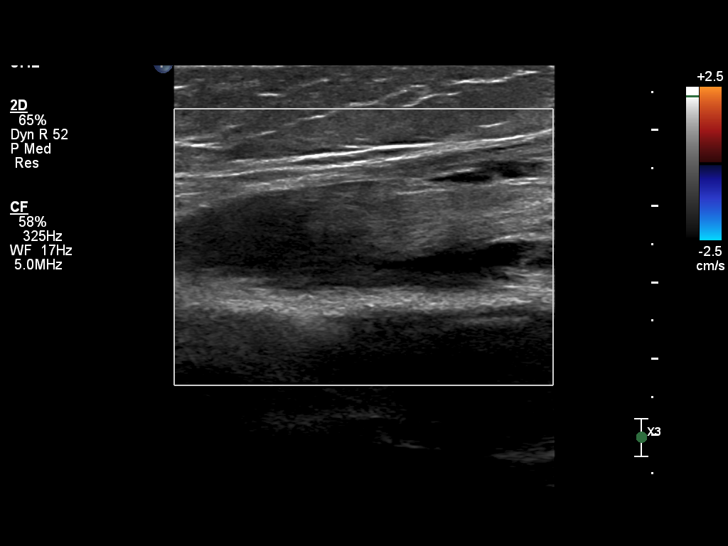
[im 11/13]
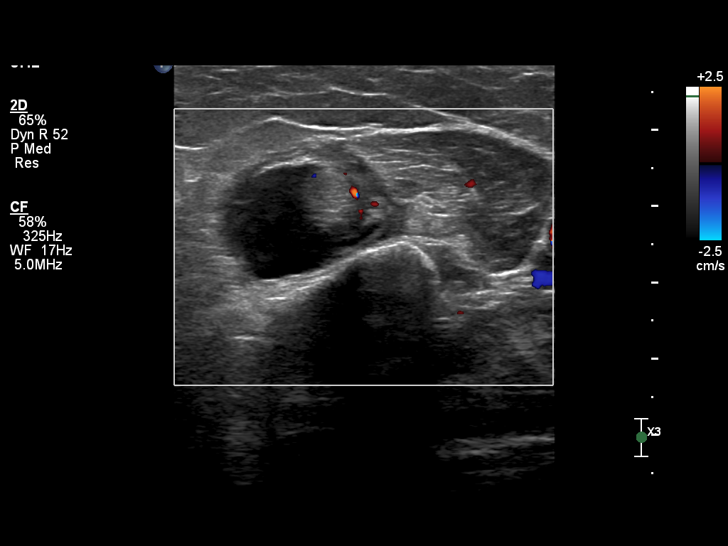
[im 12/13]
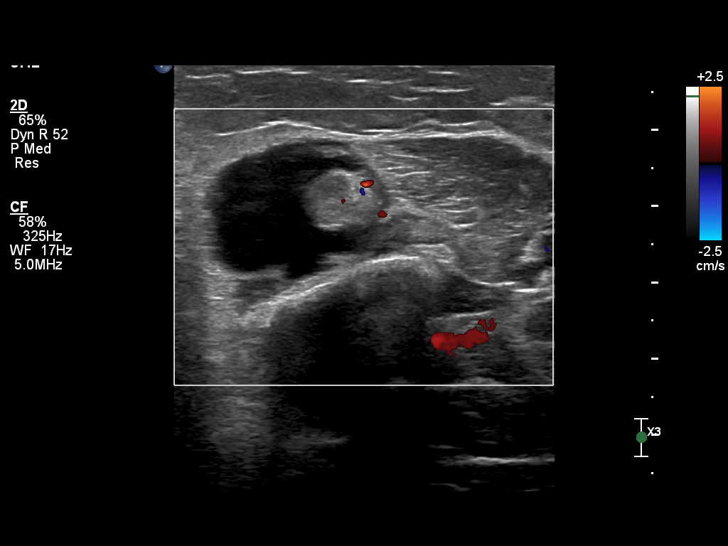
[im 13/13]
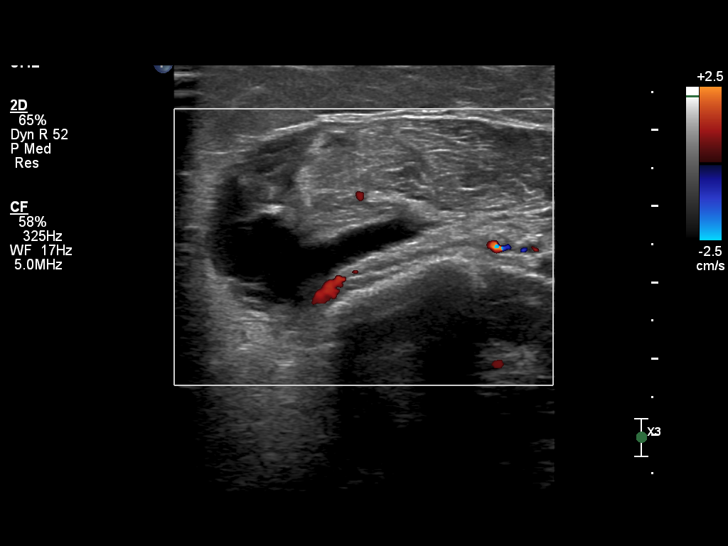

[13 of 13 positions shown; findings below may reference images not displayed]

EXAM

US Biocca ext RT

INDICATION

right  arm mass

TECHNIQUE

US Biocca ext RT

COMPARISONS

None available

FINDINGS

Hypoechoic irregular mass along the distal biceps measuring approximately 9.2 x 2.4 x
centimeters. There are mild internal echoes. Biceps looks potentially retracted.

IMPRESSION

Right upper arm mass along the biceps favoring hematoma. Correlate for potentially torn biceps
tendon.

Tech Notes:

## 2019-10-10 ENCOUNTER — Encounter: Admit: 2019-10-10 | Discharge: 2019-10-10 | Payer: MEDICARE | Primary: Family

## 2019-12-05 ENCOUNTER — Encounter: Admit: 2019-12-05 | Discharge: 2019-12-05 | Payer: MEDICARE | Primary: Family

## 2019-12-05 DIAGNOSIS — E871 Hypo-osmolality and hyponatremia: Secondary | ICD-10-CM

## 2019-12-05 DIAGNOSIS — I2510 Atherosclerotic heart disease of native coronary artery without angina pectoris: Secondary | ICD-10-CM

## 2019-12-05 DIAGNOSIS — K219 Gastro-esophageal reflux disease without esophagitis: Secondary | ICD-10-CM

## 2019-12-05 DIAGNOSIS — E782 Mixed hyperlipidemia: Secondary | ICD-10-CM

## 2019-12-05 DIAGNOSIS — I10 Essential (primary) hypertension: Secondary | ICD-10-CM

## 2019-12-05 NOTE — Progress Notes
Date of Service: 12/05/2019    Carmen Jones is a 85 y.o. female.       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 ?She had a hospitalization August 2020 for back pain and was found to be hyponatremic secondary to hydrochlorothiazide/spironolactone combination.  Of the drug she was hypertensive and we had to place her on furosemide and add felodipine for blood pressure control.  ?  Cystorrhaphy is here for routine follow-up.  She tells me she doing very well except for some pedal edema.  She was better off on her hydrochlorothiazide/spironolactone combination but this caused hyponatremia so we changed to furosemide and Lasix and she now has edema again.  It is mild but quite concerning to her.  We talked about this.  I do not think that we can go back on hydrochlorothiazide.  I am sure the felodipine carries majority the responsibility for the edema.  We could try changing her atenolol to carvedilol and find another agent for her blood pressure but it would likely for multiple drug changes.  She is only bothered at the end of the day she has been sitting for quite a while.  She would like to try to getting 10,000 steps first.  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  She has been walking more her energy is better and she does feel better.  I encouraged her to do this.    She is not having angina or chest pain she is not having palpitations or dizziness she denied having symptoms of cardiac disease the pedal edema which I believe are secondary to felodipine and dependent.    Laboratory after stop the hydrochlorothiazide/amlodipine showed her sodium went back up to 138.  BUN and creatinine remained stable.  Her liver enzymes remained stable.  Her TSH is normal.  Total cholesterol is 176 with an LDL of 91.  Ideally we should try to get her LDL lower with history of coronary disease but she has not tolerated other statins in the past we will stay on the current dose of pravastatin.    Today's EKG shows normal sinus rhythm and first-degree AV block  Examination is remarkable only for 1+ edema lower extremities    Impression  1.  Hypertension is controlled  2.  Dependent edema secondary to felodipine most likely  3.  Coronary artery disease with PCI 2013 for NSTEMI.  Last stress test was negative  4.  Hypercholesterolemia.  Not ideal control however expectantly she will tolerate it different statin.  5.  Chronic low back pain  6.  GERD    Recommend  Agree with her plan to increase her exercise.  Start OB/GYN thousand steps a day.  We will see her back in 6 months and see if she still concerned about her edema.      This note was dictated using Dragon instant transcription system.  Please pardon any inadvertent errors       Vitals:    12/05/19 0942 12/05/19 0956   BP: (!) 140/76 138/74   BP Source: Arm, Left Upper Arm, Right Upper   Patient Position: Sitting Sitting   Pulse: 69    SpO2: 97%  Weight: 80.2 kg (176 lb 12.8 oz)    Height: 1.676 m (5' 6)    PainSc: Zero      Body mass index is 28.54 kg/m?Marland Kitchen     Past Medical History  Patient Active Problem List    Diagnosis Date Noted   ? Hyponatremia 10/12/2018   ? Hypomagnesemia 10/12/2018   ? Chest pain 09/25/2016   ? 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 Mixed hyperlipidemia 11/15/2013   ? Arteriosclerotic coronary artery disease 12/01/2011     10/2011  Chest pain.  transfer form [facility] with NSTEMI          Angiogram: LAD mid 95%, LCX-OMB1 40%, dom RCA 95%.     PCI with 3.0x18 Xience DES in LAD and 3.0x15 Xience DES in RCA  08/14 regadenoson thall:  EF 87%, no ischemia, normal scan  08/18 Atypical chest pain, regaden thall: EF 90%, no ischemia      ? NSTEMI (non-ST elevated myocardial infarction) (HCC) 11/06/2011   ? Essential hypertension 11/06/2011   ? GERD (gastroesophageal reflux disease) 11/06/2011         Review of Systems   Constitutional: Negative.   HENT: Negative.    Eyes: Negative.    Cardiovascular: Positive for leg swelling.   Respiratory: Negative.    Endocrine: Negative.    Hematologic/Lymphatic: Negative.    Skin: Negative.    Musculoskeletal: Negative.    Gastrointestinal: Negative.    Genitourinary: Negative.    Neurological: Negative.    Psychiatric/Behavioral: Negative.    Allergic/Immunologic: Negative.        Physical Exam   General Appearance:?no acute distress   Skin:?warm, moist, no ulcers   HEENT:?unremarkable   Neck Veins:? neck veins are not distended   Carotid Arteries:?normal carotid upstroke bilaterally, no bruits   Chest Inspection:?chest is normal in appearance   Auscultation/Percussion:?lungs clear to auscultation, no rales, rhonchi, or wheezing   Cardiac Rhythm:?regular rhythm and normal rate   Cardiac Auscultation:?Normal S1 &?S2, no S3 or S4, no rub   Murmurs:?no cardiac murmurs   Extremities:?no lower extremity edema; 2+ symmetric distal pulses   Abdominal Exam:?soft, non-tender, no masses, bowel sounds normal   Liver & Spleen:?no organomegaly   Neurologic Exam:?neurological assessment grossly intact      Cardiovascular Health Factors  Vitals BP Readings from Last 3 Encounters:   12/05/19 138/74   04/13/19 (!) 149/77   02/23/19 (!) 140/80     Wt Readings from Last 3 Encounters:   12/05/19 80.2 kg (176 lb 12.8 oz)   04/13/19 80.7 kg (178 lb)   02/23/19 80.3 kg (177 lb)     BMI Readings from Last 3 Encounters:   12/05/19 28.54 kg/m?   04/13/19 29.62 kg/m?   02/23/19 28.57 kg/m?      Smoking Social History     Tobacco Use   Smoking Status Never Smoker   Smokeless Tobacco Never Used      Lipid Profile Cholesterol   Date Value Ref Range Status   03/10/2018 161  Final     HDL   Date Value Ref Range Status   03/10/2018 56  Final     LDL Date Value Ref Range Status   03/10/2018 96  Final     Triglycerides   Date Value Ref Range Status   03/10/2018 98  Final      Blood Sugar Hemoglobin A1C   Date Value Ref Range Status   07/31/2017 5.8  Final     Glucose  Date Value Ref Range Status   01/05/2019 105  Final   10/18/2018 107 (H) 70 - 100 MG/DL Final   16/11/9602 540 (H) 70 - 100 MG/DL Final     Glucose, POC   Date Value Ref Range Status   11/05/2011 150 (H) 70 - 100 MG/DL Final          Current Medications (including today's revisions)  ? acetaminophen (TYLENOL) 500 mg tablet Take 500 mg by mouth as Needed for Pain. Max of 4,000 mg of acetaminophen in 24 hours.   ? aspirin EC 81 mg tablet Take 81 mg by mouth daily.   ? atenoloL (TENORMIN) 25 mg tablet Take one-half tablet by mouth at bedtime daily.   ? cholecalciferol (Vitamin D3) (VITAMIN D-3) 1,000 units tablet Take 1,000 Units by mouth daily.   ? docusate (COLACE) 100 mg capsule Take 200 mg by mouth every morning.   ? esomeprazole DR(+) (NEXIUM) 40 mg capsule Take 40 mg by mouth every morning. Take on an empty stomach at least 1 hour before or 2 hours after food.   ? felodipine (PLENDIL) 5 mg ER tablet TAKE 1 TABLET BY MOUTH DAILY. TAKE ON AN EMPTY STOMACH. (Patient taking differently: Take 5 mg by mouth at bedtime daily. Take on an empty stomach.)   ? furosemide (LASIX) 40 mg tablet Take one tablet by mouth every morning.   ? losartan (COZAAR) 50 mg tablet TAKE 1 TABLET BY MOUTH TWICE A DAY   ? magnesium oxide (MAG-OX) 400 mg tablet Take 400 mg by mouth twice daily.   ? naproxen sodium (ALEVE) 220 mg tablet Take  by mouth as Needed. Taking 1 tablet twice daily   ? oxyCODONE (ROXICODONE) 5 mg tablet Take 5 mg by mouth at bedtime daily   ? potassium chloride (KLOR-CON) 10 mEq tablet Take one tablet by mouth daily. Take with a meal and a full glass of water.   ? pravastatin (PRAVACHOL) 40 mg tablet TAKE 1 TABLET BY MOUTH EVERY DAY (Patient taking differently: Take 40 mg by mouth at bedtime daily.) ? senna (SENOKOT) 8.6 mg tablet Take one tablet by mouth twice daily.   ? soy isofla/blk cohosh/mag bark (ESTROVEN PO) Take  by mouth.   ? temazepam (RESTORIL) 15 mg capsule Take 15 mg by mouth at bedtime as needed.

## 2019-12-06 ENCOUNTER — Encounter: Admit: 2019-12-06 | Discharge: 2019-12-06 | Payer: MEDICARE | Primary: Family

## 2020-02-06 ENCOUNTER — Encounter: Admit: 2020-02-06 | Discharge: 2020-02-06 | Payer: MEDICARE | Primary: Family

## 2020-02-06 MED ORDER — POTASSIUM CHLORIDE 10 MEQ PO TBER
ORAL_TABLET | Freq: Every day | 3 refills | 30.00000 days | Status: AC
Start: 2020-02-06 — End: ?

## 2020-02-06 NOTE — Telephone Encounter
Received a request via computer from the patients pharmacy requesting a refill.  Script e-scribed as requested.

## 2020-03-06 ENCOUNTER — Encounter: Admit: 2020-03-06 | Discharge: 2020-03-06 | Payer: MEDICARE | Primary: Family

## 2020-03-06 NOTE — Progress Notes
Records Request    Carmen Jones DOB: [birth year]    STAT    Medical records request for continuation of care:    Patient has appointment on 03/14/20 with [name].    Please fax records to [facility] [place] [facility] [phone]    Request records:    Recent Labs    Thank you,      Cardiovascular Medicine  [facility]  [address]  [place], [place] [address]  Phone:  [phone]  Fax:  [phone]

## 2020-03-13 ENCOUNTER — Encounter: Admit: 2020-03-13 | Discharge: 2020-03-13 | Payer: MEDICARE | Primary: Family

## 2020-03-24 ENCOUNTER — Encounter: Admit: 2020-03-24 | Discharge: 2020-03-24 | Payer: MEDICARE | Primary: Family

## 2020-03-24 MED ORDER — FUROSEMIDE 40 MG PO TAB
40 mg | ORAL_TABLET | Freq: Every morning | ORAL | 3 refills
Start: 2020-03-24 — End: ?

## 2020-07-18 ENCOUNTER — Encounter: Admit: 2020-07-18 | Discharge: 2020-07-18 | Payer: MEDICARE | Primary: Family

## 2021-02-04 ENCOUNTER — Encounter: Admit: 2021-02-04 | Discharge: 2021-02-04 | Payer: MEDICARE | Primary: Family

## 2021-02-04 MED ORDER — POTASSIUM CHLORIDE 10 MEQ PO TBER
ORAL_TABLET | Freq: Every day | 3 refills
Start: 2021-02-04 — End: ?

## 2021-02-13 ENCOUNTER — Encounter: Admit: 2021-02-13 | Discharge: 2021-02-13 | Payer: MEDICARE | Primary: Family

## 2021-02-13 MED ORDER — POTASSIUM CHLORIDE 10 MEQ PO TBER
10 meq | ORAL_TABLET | Freq: Every day | ORAL | 3 refills | 30.00000 days | Status: AC
Start: 2021-02-13 — End: ?

## 2021-03-19 ENCOUNTER — Encounter: Admit: 2021-03-19 | Discharge: 2021-03-19 | Payer: MEDICARE | Primary: Family

## 2021-03-19 MED ORDER — FUROSEMIDE 40 MG PO TAB
40 mg | ORAL_TABLET | Freq: Every morning | ORAL | 0 refills | 90.00000 days | Status: AC
Start: 2021-03-19 — End: ?

## 2021-04-08 ENCOUNTER — Encounter: Admit: 2021-04-08 | Discharge: 2021-04-08 | Payer: MEDICARE | Primary: Family

## 2021-04-12 ENCOUNTER — Encounter: Admit: 2021-04-12 | Discharge: 2021-04-12 | Payer: MEDICARE | Primary: Family

## 2021-04-12 DIAGNOSIS — I2510 Atherosclerotic heart disease of native coronary artery without angina pectoris: Secondary | ICD-10-CM

## 2021-04-12 DIAGNOSIS — I10 Essential (primary) hypertension: Secondary | ICD-10-CM

## 2021-04-12 DIAGNOSIS — E782 Mixed hyperlipidemia: Secondary | ICD-10-CM

## 2021-04-12 DIAGNOSIS — E871 Hypo-osmolality and hyponatremia: Secondary | ICD-10-CM

## 2021-04-12 DIAGNOSIS — K219 Gastro-esophageal reflux disease without esophagitis: Secondary | ICD-10-CM

## 2021-04-12 NOTE — Progress Notes
Date of Service: 04/12/2021    Carmen Jones is a 87 y.o. female.       HPI     Carmen Jones was seen in our office today for routine follow-up.   She resides at the [facility] in [place], [place].  She is a 87 year old female followed in our office by [name].  The patient has a medical history significant for atherosclerotic coronary artery disease status post angioplasty and stenting of her proximal LAD  and mid RCA after presenting with a non STEMI in 10/2011, hypertension, hypercholesterolemia, hiatal hernia and difficulty with insomnia.  She had a hospitalization August 2020 for back pain and was found to be hyponatremic secondary to hydrochlorothiazide/spironolactone combination.  Off the drug she was hypertensive and we had to place her on furosemide and add felodipine for blood pressure control.     The patient was last seen in our office on 12/05/2019 with [name].  No changes were made in her medical therapy.  She was asked to increase her exercise and try to get in 10,000 steps a day.  She was asked to return for follow-up visit in 6 months.     She did have a scheduled visit in June 2022 but canceled that visit.     She was seen and evaluated in the emergency department at [facility] in [place], [place] on 04/05/21 for abdominal pain. Symptoms waxed and waned throughout the night until arrival to the ED patient reported that she thought her pain was caused by taking naproxen about an hour after dinner the night before.  She denies having chest pain, nausea, vomiting or diarrhea.  She has had several EGDs in the past by [name].  States she has a hiatal hernia but no history of peptic ulcer disease.  Blood pressure on presentation was 161/72, pulse 80 bpm.  ECG showed sinus rhythm at 66 bpm.  No ST-T wave changes.  Laboratories: Sodium 128, potassium 4.1, chloride 96, BUN 16, creatinine 0.8, glucose 123, liver function tests within normal/acceptable range.  Troponin negative x1. Lipase normal.  Famotidine 20 mg twice daily was added to her regimen.  Continued on PTA esomeprazole 40 mg daily.  Advised to follow-up with PCP in 1 week.  She was hyponatremic.  They discussed her sodium intake and possibly due to excess free water intake over the past couple of days she was advised to have a little more salt over the weekend.    She did undergo a 2D echo Doppler with Definity constrast on 04/08/21 at [facility]. The echo demonstrates  Normal LV size and systolic function with EF 65 to 70%.  Mild LVH.  Normal diastolic function.  No wall motion abnormalities.  Mildly dilated right ventricle with normal function.  Normal biatrial size.  Significant mitral or aortic valve insufficiency.  The tricuspid valve was not well visualized.  RV systolic pressure 41 mmHg consistent with mild pulmonary hypertension.  Aortic root normal in size with mild dilatation of the ascending aorta measuring 3.7 cm     Today the patient reports no complaints and tells me overall she is feeling very well.  She denies cardiac symptoms.  She tells me she has had no recurrence of abdominal pain since she was started on Pepcid at her ED visit on 04/05/2021 as described above.  She continues to take Nexium 40 mg daily as well.  She denies nausea, vomiting and diarrhea.  She has had no abdominal or flank  pain.  She tells me she has to be careful about the amount of food she eats more than actually what she eats.  If she overeats then she has GI distress.  She tells me that her last endoscopy was about 10 years ago.  She has scheduled follow-up with her primary care provider next week and tells me she is going to discuss with her whether or not she needs a updated upper endoscopy.  In the past when she was taking hydrochlorothiazide/spironolactone combination but this caused hyponatremia so we changed to Lasix.  With the Lasix alone she continued to have  edema which is mild and not getting worse.   We have discussed with her that she should not go back on the hydrochlorothiazide.  Laboratories after stopping hydrochlorothiazide/amlodipine showed her sodium went back up to 138, BUN and creatinine remained stable.  However, she was hyponatremic in the ED on 2/17 and we will check follow-up laboratories.  She is on felodipine which is probably a major cause of the edema, however again her swelling is mild and only bothers her  at the end of the day after she has been sitting for quite a while.  She was hyponatremic in the ED on 2/17 and we will check follow-up laboratories. She has been walking more and feels like her energy is better and overall she feels better.   She does report having very infrequent episodes of slight pain in the right jaw that occurs only about every 3 to 4 months and last less than a minute but more like a few seconds at a time.  Her symptoms are stable and she tells me they have in the same and unchanged for over the last 1 or 2 years.  She does not have any associated symptoms and does not have any symptoms with exertion or that wake her up at night.  Again these episodes only occur about once every 3 to 4 months and feels like overall they are less frequent than in the past. She denies having chest pain, exertional chest pain/pressure/tightness, shortness of breath at rest, PND, orthopnea.  She denies having palpitations, dizziness, lightheadedness, near-syncope and syncope.   I reviewed her blood pressure diary which she brings with her today.  Since January of this year her systolic readings are in the range of 107-136, diastolic 58-69, pulse 60s to low 70s.  Most of her systolic readings are below 128 mmHg.  In our office today her blood pressure is 124/68, pulse 64 bpm. She weighs 174 pounds which is stable from her last visit.  She tells me she does not add extra salt to her food and tries to avoid salty foods.  She tells me that she drinks quite a bit of water probably more than 72 ounces a day.  Of note is that when she was on a full tablet of atenolol 25 mg she had problems with slow heart rates.  She is currently taking atenolol 12.5 mg daily.    Cardiovascular Studies  I reviewed the patient's twelve-lead ECG done on 04/05/2021 at [facility].  The EKG shows normal sinus rhythm at 66 bpm.  There is a first-degree AV block with PR interval of 232 ms.  QRS 82 ms, QTc 406 ms.  In comparison to the prior ECG done in our office dated 12/05/2019 there are no significant changes.  Patient also had a first-degree AV block on her prior ECG.     Assessment and Plan:   1.  Hypertension.  Controlled. Recent echo performed on 04/08/21  ([facility]) shows normal LV systolic function, EF 65-70%, normal diastolic, no wall motion abnormalities, mildly dilated RV, no significant valvular abnormalities and miild pulmonary hypertension with RVSP 41 mmHG.      2.  Dependent lower extremity edema secondary to felodipine, most likely etiology.  Her lower extremity edema is controlled and mild on examination today.  She has had no worsening peripheral swelling.     3.  Coronary artery disease with non-STEMI in 2013 with PCI at that time with 2 drug-eluting stents to the proximal PDA and mid RCA.  Her last stress test performed in 2018 negative for ischemia.     4.  Hypercholesterolemia.  Treated.  Her last lipid profile in our records from 05/04/2019 shows a total cholesterol 176, triglycerides 95, LDL 91.  Her LDL is not ideal.  Given that she has coronary artery disease we recommend a target LDL below 70.     5.  Chronic low back pain.     6.  Mild pulmonary hypertension.     7. Mildly dilated ascending aorta.     8. GERD.    9.  Elevated BMI 29.8.   I discussed with the patient today regarding f weight loss.  I have talked her about making lifestyle modifications through a healthy diet regular exercise with goal towards weight reduction.     -No medication changes.  Continue current medical therapy.  -Check BMP, fasting lipid profile, ALT and AST-these can be drawn at her PCP follow-up visit next week on 3/3//23..  -Advised patient to call office if her symptoms/jaw pain worsens or becomes more frequent or severe.  -I reviewed and discussed signs and symptoms of angina and advised patient if she has anginal symptoms to call our office o go immediately to the emergency department.  --Risk factors reduction and lifestyle modifications.    -Cardiac healthy low-fat, low-cholesterol, low triglyceride, low-sodium diet.  -Rest importance of a low-salt diet.  - Advised to limit free water to no more than 64 ounces a day.  - Recommend increasing her exercise/walking.  Of asked patient to start a regular walking program, target minimum of 30 minutes of walking/moderate aerobic exercise 5 days a week.  - Encouraged to get in close to 10,000 steps a day.  -Recommend weight reduction.  Encouraged to lose 10 pounds every 3 months.  Suggest Mediterranean diet   -Encouraged to elevate her legs while sitting.    -Advised follow up with her PCP as scheduled on 04/19/2021.    Follow-up: Dr. Avie Arenas in [place] in 10-12 months..  Patient encouraged to contact our office if she has problems prior to next visit.     I have educated the patient on the plan of care today. Patient verbally expressed understanding and agreement with the plan. Instructions are outlined in the after visit summary document.      Thank you for the opportunity to participate in this pleasant patient's care. Please don't hesitate to contact me with any concerns or questions.     Total encounter time 50  minutes including time during the encounter in clinic to review chart,review of OSH records, medical history, personally reviewing lab results, most recent ECG or heart rhythm monitoring data, radiologic and cardiovascuilar test results,  physical exam, assessment, formulation of treatment plan and documentation. I reviewed and discussed his/her  heart disease, medication instructions, medication interactions, treatment options/risks/benefits, blood pressure monitoring, blood pressure goals, diet/sodium restriction,exercise,  follow-up  plan. Discussion related to plan as outlined and addressing all questions related to the care plan while educating on the importance of adherence to recommended therapies outpatient follow-up.  Answered all questions to patient's satisfaction.                       DRB  Vitals:    04/12/21 1030   BP: 124/68   BP Source: Arm, Left Upper   Pulse: 64   SpO2: 97%   O2 Percent: 97 %   O2 Device: None (Room air)   PainSc: Zero   Weight: 78.9 kg (174 lb)   Height: 162.6 cm (5' 4)     Body mass index is 29.87 kg/m?Marland Kitchen     Past Medical History  Patient Active Problem List    Diagnosis Date Noted   ? Hyponatremia 10/12/2018   ? Hypomagnesemia 10/12/2018   ? Chest pain 09/25/2016   ? 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 Mixed hyperlipidemia 11/15/2013   ? Arteriosclerotic coronary artery disease 12/01/2011     10/2011  Chest pain.  transfer form [facility] with NSTEMI          Angiogram: LAD mid 95%, LCX-OMB1 40%, dom RCA 95%.     PCI with 3.0x18 Xience DES in LAD and 3.0x15 Xience DES in RCA  08/14 regadenoson thall:  EF 87%, no ischemia, normal scan  08/18 Atypical chest pain, regaden thall: EF 90%, no ischemia      ? NSTEMI (non-ST elevated myocardial infarction) (HCC) 11/06/2011   ? Essential hypertension 11/06/2011   ? GERD (gastroesophageal reflux disease) 11/06/2011         Review of Systems   Constitutional: Positive for malaise/fatigue.   HENT: Positive for tinnitus.    Eyes: Positive for blurred vision.   Cardiovascular: Positive for dyspnea on exertion and leg swelling.   Respiratory: Negative.    Endocrine: Positive for polydipsia.   Hematologic/Lymphatic: Negative.    Skin: Positive for dry skin.   Musculoskeletal: Positive for arthritis, back pain and muscle weakness.   Gastrointestinal: Positive for constipation, flatus, heartburn and hemorrhoids.   Genitourinary: Positive for bladder incontinence and nocturia.   Neurological: Positive for excessive daytime sleepiness and loss of balance.   Psychiatric/Behavioral: The patient has insomnia.    Allergic/Immunologic: Negative.        Physical Exam  Vital signs were reviewed.   General Appearance: appears well nourished, appears relaxed, in no acute distress,wearing a face mask  Skin: warm, moist, intact, no rash or lesions, no xanthomas  HEENT: unremarkable, pupils equal and round, no scleral icterus, conjunctivae and lids normal  Lips & Mouth: no pallor or cyanosis  Neck Veins: JVP normal, JVP is not elevated above the sternal notch,neck veins are flat, neck veins are not distended   Carotid Arteries: normal carotid upstroke bilaterally, no bruits bilaterally  Chest Inspection: chest is normal in appearance  Auscultation/Percussion/Effort: lungs clear to auscultation, no rales, rhonchi, or wheezing, respirations even and unlabored, no respiratory distress  Cardiac Rhythm: regular rhythm and normal rate   Cardiac Auscultation: normal S1 & S2, no S3 or S4, no rub   Murmurs: no cardiac murmurs   Extremities: trace lower extremity edema bilaterally, 2+ symmetric distal pulses   Abdominal Exam: soft, non-tender,non-distended, no obvious masses, bowel sounds normal, no guarding, no abdominal bruits  Liver & Spleen: no organomegaly   Neurologic Exam: grossly intact, alert, moves all extremities equally   Orientation: oriented to time, person and  place, clear historian  Gait: normal, steady, walks without assistance  Language & Memory: speech clear, patient responsive, seems to comprehend information  Psych: appropriate mood and affect, thought content and behavior normal                Cardiovascular Health Factors  Vitals BP Readings from Last 3 Encounters:   04/12/21 124/68   12/05/19 138/74   04/13/19 (!) 149/77     Wt Readings from Last 3 Encounters:   04/12/21 78.9 kg (174 lb)   12/05/19 80.2 kg (176 lb 12.8 oz)   04/13/19 80.7 kg (178 lb)     BMI Readings from Last 3 Encounters:   04/12/21 29.87 kg/m?   12/05/19 28.54 kg/m?   04/13/19 29.62 kg/m?      Smoking Social History     Tobacco Use   Smoking Status Never   Smokeless Tobacco Never      Lipid Profile Cholesterol   Date Value Ref Range Status   05/04/2019 176  Final     HDL   Date Value Ref Range Status   05/04/2019 66  Final     LDL   Date Value Ref Range Status   05/04/2019 91  Final     Triglycerides   Date Value Ref Range Status   05/04/2019 95  Final      Blood Sugar Hemoglobin A1C   Date Value Ref Range Status   07/31/2017 5.8  Final     Glucose   Date Value Ref Range Status   04/05/2021 123 (H) 70 - 105 Final   05/04/2019 104  Final   01/05/2019 105  Final     Glucose, POC   Date Value Ref Range Status   11/05/2011 150 (H) 70 - 100 MG/DL Final          Problems Addressed Today  Encounter Diagnoses   Name Primary?   ? Arteriosclerotic coronary artery disease Yes   ? Mixed hyperlipidemia    ? Hypomagnesemia    ? Hyponatremia                    Current Medications (including today's revisions)  ? acetaminophen (TYLENOL) 500 mg tablet Take one tablet by mouth as Needed for Pain. Max of 4,000 mg of acetaminophen in 24 hours.   ? aspirin EC 81 mg tablet Take one tablet by mouth daily.   ? atenoloL (TENORMIN) 25 mg tablet Take one-half tablet by mouth at bedtime daily.   ? cholecalciferol (Vitamin D3) (VITAMIN D-3) 1,000 units tablet Take one tablet by mouth daily.   ? docusate (COLACE) 100 mg capsule Take two capsules by mouth every morning.   ? esomeprazole DR(+) (NEXIUM) 40 mg capsule Take one capsule by mouth every morning. Take on an empty stomach at least 1 hour before or 2 hours after food.   ? famotidine (PEPCID) 20 mg tablet Take one tablet by mouth twice daily.   ? felodipine (PLENDIL) 5 mg ER tablet TAKE 1 TABLET BY MOUTH DAILY. TAKE ON AN EMPTY STOMACH. (Patient taking differently: Take one tablet by mouth at bedtime daily. Take on an empty stomach.)   ? furosemide (LASIX) 40 mg tablet TAKE ONE TABLET BY MOUTH EVERY MORNING   ? losartan (COZAAR) 50 mg tablet TAKE 1 TABLET BY MOUTH TWICE A DAY   ? magnesium oxide (MAG-OX) 400 mg tablet Take one tablet by mouth twice daily.   ? naproxen sodium (ALEVE) 220 mg tablet Take  by mouth as Needed. Taking 1 tablet twice daily   ? oxyCODONE (ROXICODONE) 5 mg tablet Take one tablet by mouth at bedtime daily.   ? potassium chloride (KLOR-CON 10) 10 mEq tablet Take one tablet by mouth daily. Take with a meal and a full glass of water.   ? pravastatin (PRAVACHOL) 40 mg tablet TAKE 1 TABLET BY MOUTH EVERY DAY (Patient taking differently: Take one tablet by mouth at bedtime daily.)   ? senna (SENOKOT) 8.6 mg tablet Take one tablet by mouth twice daily. (Patient taking differently: Take one tablet by mouth daily.)   ? soy isofla/blk cohosh/mag bark (ESTROVEN PO) Take  by mouth.   ? temazepam (RESTORIL) 15 mg capsule Take two capsules by mouth at bedtime daily.

## 2021-04-18 ENCOUNTER — Encounter: Admit: 2021-04-18 | Discharge: 2021-04-18 | Payer: MEDICARE | Primary: Family

## 2021-04-18 DIAGNOSIS — E782 Mixed hyperlipidemia: Secondary | ICD-10-CM

## 2021-04-18 DIAGNOSIS — E871 Hypo-osmolality and hyponatremia: Secondary | ICD-10-CM

## 2021-04-18 DIAGNOSIS — I2510 Atherosclerotic heart disease of native coronary artery without angina pectoris: Secondary | ICD-10-CM

## 2021-04-18 LAB — LIPID PROFILE
CHOLESTEROL/HDL %: 3
CHOLESTEROL: 132
LDL: 63
TRIGLYCERIDES: 87
VLDL: 17

## 2021-04-18 LAB — ALT (SGPT): ALT: 8

## 2021-04-18 LAB — BASIC METABOLIC PANEL
BLD UREA NITROGEN: 15 FL — ABNORMAL LOW (ref 7–11)
CHLORIDE: 103 mg/dL (ref 0.4–1.00)
CO2: 25 mg/dL (ref 8.5–10.6)
CREATININE: 0.8 mg/dL (ref 0.3–1.2)
POTASSIUM: 4.2 mg/dL (ref 7–25)
SODIUM: 137 pg — ABNORMAL HIGH (ref 26–34)

## 2021-04-18 LAB — AST (SGOT): AST: 14

## 2021-04-22 ENCOUNTER — Encounter: Admit: 2021-04-22 | Discharge: 2021-04-22 | Payer: MEDICARE | Primary: Family

## 2021-04-23 ENCOUNTER — Encounter: Admit: 2021-04-23 | Discharge: 2021-04-23 | Payer: MEDICARE | Primary: Family

## 2021-04-23 DIAGNOSIS — K449 Diaphragmatic hernia without obstruction or gangrene: Secondary | ICD-10-CM

## 2021-04-23 NOTE — Telephone Encounter
Call placed to pt to discuss referral for hiatal hernia repair. Pt reports she was needing an appointment for an endoscopy, that she hasn't had any recent imaging, not for at least 10 years. Advised pt the surgical referral will be cancelled and a referral for GI will be entered.

## 2021-04-24 ENCOUNTER — Encounter: Admit: 2021-04-24 | Discharge: 2021-04-24 | Payer: MEDICARE | Primary: Family

## 2021-11-12 ENCOUNTER — Encounter: Admit: 2021-11-12 | Discharge: 2021-11-12 | Payer: MEDICARE | Primary: Family

## 2021-11-12 ENCOUNTER — Ambulatory Visit: Admit: 2021-11-12 | Discharge: 2021-11-13 | Payer: MEDICARE | Primary: Family

## 2021-11-12 DIAGNOSIS — I10 Essential (primary) hypertension: Secondary | ICD-10-CM

## 2021-11-12 DIAGNOSIS — E871 Hypo-osmolality and hyponatremia: Secondary | ICD-10-CM

## 2021-11-12 DIAGNOSIS — M5441 Lumbago with sciatica, right side: Secondary | ICD-10-CM

## 2021-11-12 DIAGNOSIS — I2510 Atherosclerotic heart disease of native coronary artery without angina pectoris: Secondary | ICD-10-CM

## 2021-11-12 DIAGNOSIS — I214 Non-ST elevation (NSTEMI) myocardial infarction: Secondary | ICD-10-CM

## 2021-11-12 DIAGNOSIS — E782 Mixed hyperlipidemia: Secondary | ICD-10-CM

## 2021-11-12 DIAGNOSIS — K219 Gastro-esophageal reflux disease without esophagitis: Secondary | ICD-10-CM

## 2021-11-12 DIAGNOSIS — Z955 Presence of coronary angioplasty implant and graft: Secondary | ICD-10-CM

## 2021-11-12 DIAGNOSIS — Z136 Encounter for screening for cardiovascular disorders: Secondary | ICD-10-CM

## 2021-11-12 MED ORDER — ACETAMINOPHEN 500 MG PO TAB
500 mg | ORAL_TABLET | ORAL | 3 refills | Status: AC | PRN
Start: 2021-11-12 — End: ?

## 2021-11-12 MED ORDER — LOSARTAN 50 MG PO TAB
25 mg | ORAL_TABLET | Freq: Two times a day (BID) | ORAL | 3 refills | 90.00000 days | Status: AC
Start: 2021-11-12 — End: ?

## 2021-11-12 MED ORDER — MELOXICAM 7.5 MG PO TAB
7.5 mg | ORAL_TABLET | Freq: Two times a day (BID) | ORAL | 3 refills | 30.00000 days | Status: AC
Start: 2021-11-12 — End: ?

## 2021-11-12 NOTE — Patient Instructions
Thank you for visiting our office today.    We would like to make the following medication adjustments:      Meloxicam 7.5mg  twice daily   Losartan 25mg  twice daily  Lasix half tablet every other day      Otherwise continue the same medications as you have been doing.          We will be pursuing the following tests after your appointment today:       Orders Placed This Encounter    ECG 12-LEAD    2D + DOPPLER ECHO    meloxicam (MOBIC) 7.5 mg tablet    acetaminophen (TYLENOL EXTRA STRENGTH) 500 mg tablet    losartan (COZAAR) 50 mg tablet        Please call us in the meantime with any questions or concerns.        Please allow 5-7 business days for our providers to review your results. All normal results will go to MyChart. If you do not have Mychart, it is strongly recommended to get this so you can easily view all your results. If you do not have mychart, we will attempt to call you once with normal lab and testing results. If we cannot reach you by phone with normal results, we will send you a letter.  If you have not heard the results of your testing after one week please give [place] a call.       Your Cardiovascular Medicine [place] Team Richardson Landry, Rene Kocher, Threasa Beards, and [place])  phone number is [phone].

## 2021-11-12 NOTE — Progress Notes
Date of Service: 11/12/2021    Carmen Jones is a 87 y.o. female.       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    Sister Harlie reports that couple of months ago she did experience an episode of chest pain, this occurred at rest, it radiated across the precordium, it was not associated with any other symptoms and it resolved in about 15 minutes.  Few days later patient had a similar episode.    She was last evaluated with a perfusion imaging study in August 2018, the tomographic pattern did not demonstrate ischemia, normal LVEF = 90%.    She does report occasional bilateral lower extremity edema, this could represent chronic HFpEF or perhaps also a side effect of the calcium channel blocker that she is currently on for hypertension.  Patient does monitor her blood pressure and there are times when she recalls a BP = 90/60 mmHg.  She has not had any dizziness, no lightheadedness, no presyncope or syncope.    She does have chronic hyponatremia, to some extent this could be induced by the loop diuretic treatment, it was 128 mEq/L on 04/05/2021 and 137 mEq/L on 04/18/2021.       Vitals:    11/12/21 0945   BP: 122/60   BP Source: Arm, Left Upper   Pulse: 82   SpO2: 95%   O2 Percent: 95 %   O2 Device: None (Room air)   PainSc: Zero   Weight: 76 kg (167 lb 9.6 oz)   Height: 162.6 cm (5' 4)     Body mass index is 28.77 kg/m?Marland Kitchen     Past Medical History  Patient Active Problem List    Diagnosis Date Noted   ? Hyponatremia 10/12/2018   ? Hypomagnesemia 10/12/2018   ? Chest pain 09/25/2016   ? 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 Mixed hyperlipidemia 11/15/2013   ? Arteriosclerotic coronary artery disease 12/01/2011     10/2011  Chest pain.  transfer form [facility] with NSTEMI          Angiogram: LAD mid 95%, LCX-OMB1 40%, dom RCA 95%.     PCI with 3.0x18 Xience DES in LAD and 3.0x15 Xience DES in RCA  08/14 regadenoson thall:  EF 87%, no ischemia, normal scan  08/18 Atypical chest pain, regaden thall: EF 90%, no ischemia      ? NSTEMI (non-ST elevated myocardial infarction) (HCC) 11/06/2011   ? Essential hypertension 11/06/2011   ? GERD (gastroesophageal reflux disease) 11/06/2011         Review of Systems   Constitutional: Positive for malaise/fatigue.   HENT: Positive for tinnitus.    Eyes: Negative.    Cardiovascular: Positive for chest pain, dyspnea on exertion, irregular heartbeat and leg swelling.   Respiratory: Negative.    Endocrine: Negative.    Hematologic/Lymphatic: Negative.    Skin: Negative.    Musculoskeletal: Positive for back pain, falls and muscle weakness.   Gastrointestinal: Positive for constipation, dysphagia and heartburn.   Genitourinary: Negative.    Neurological: Positive for loss of balance, numbness, paresthesias and weakness.   Psychiatric/Behavioral: Negative.    Allergic/Immunologic: Negative.        Physical Exam  General Appearance: normal in appearance  Skin: warm, moist, no ulcers or xanthomas  Eyes: conjunctivae and lids normal, pupils are equal and round  Lips & Oral Mucosa: no pallor or cyanosis  Neck Veins: neck veins are flat, neck veins are not distended  Chest Inspection: chest is normal in appearance  Respiratory Effort: breathing comfortably, no respiratory distress  Auscultation/Percussion: lungs clear to auscultation, no rales or rhonchi, no wheezing  Cardiac Rhythm: regular rhythm and normal rate  Cardiac Auscultation: S1, S2 normal, no rub, no gallop  Murmurs: no murmur  Carotid Arteries: normal carotid upstroke bilaterally, no bruit  Lower Extremity Edema: 1+ bilateral, pretibial lower extremity edema  Abdominal Exam: soft, non-tender, no masses, bowel sounds normal  Liver & Spleen: no organomegaly  Language and Memory: patient responsive and seems to comprehend information  Neurologic Exam: neurological assessment grossly intact      Cardiovascular Studies    Twelve-lead EKG demonstrates normal sinus rhythm, first-degree AV block, PR interval 256 ms, no axis deviation  Cardiovascular Health Factors  Vitals BP Readings from Last 3 Encounters:   11/12/21 122/60   04/12/21 124/68   12/05/19 138/74     Wt Readings from Last 3 Encounters:   11/12/21 76 kg (167 lb 9.6 oz)   04/12/21 78.9 kg (174 lb)   12/05/19 80.2 kg (176 lb 12.8 oz)     BMI Readings from Last 3 Encounters:   11/12/21 28.77 kg/m?   04/12/21 29.87 kg/m?   12/05/19 28.54 kg/m?      Smoking Social History     Tobacco Use   Smoking Status Never   Smokeless Tobacco Never      Lipid Profile Cholesterol   Date Value Ref Range Status   04/18/2021 132  Final     HDL   Date Value Ref Range Status   04/18/2021 52  Final     LDL   Date Value Ref Range Status   04/18/2021 63  Final     Triglycerides   Date Value Ref Range Status   04/18/2021 87  Final      Blood Sugar Hemoglobin A1C   Date Value Ref Range Status   07/31/2017 5.8  Final     Glucose   Date Value Ref Range Status   04/18/2021 102  Final   04/05/2021 123 (H) 70 - 105 Final   05/04/2019 104  Final     Glucose, POC   Date Value Ref Range Status   11/05/2011 150 (H) 70 - 100 MG/DL Final          Problems Addressed Today  Encounter Diagnoses   Name Primary?   ? NSTEMI (non-ST elevated myocardial infarction) (HCC) Yes   ? Essential hypertension    ? Mixed hyperlipidemia    ? Hypomagnesemia    ? Hyponatremia    ? Arteriosclerotic coronary artery disease    ? Screening for heart disease    ? Presence of stent in LAD coronary artery    ? S/P right coronary artery (RCA) stent placement    ? Chronic right-sided low back pain with right-sided sciatica        Assessment and Plan     Assessment:    1.  Chest pain  ? This was precordial, it lasted approximately 15 minutes and it occurred at rest  ? Patient did not take a sublingual nitroglycerin  ? She was last evaluated with a perfusion imaging study in August 2018, she was not found to  have ischemia  2.  Known history of coronary artery disease  3.  History of non-ST MI  4.  Status post LHC in September 2013  ? Status post PCI to LAD and RCA  5.  Primary hypertension-currently on a combination of beta-blocker, dihydropyridine calcium channel blocker, loop diuretic and losartan  ? Patient has reported occasional episodes of hypotension  6.  Chronic HFpEF and bilateral lower extremity edema due to underlying cardiac pathology  7.  Right lower back sciatica-patient does use a cane and intermittently a walker for ambulation  8.  Chronic hyponatremia-possibly a side effect of the loop diuretic  9.  Hyperlipidemia-patient is on statin therapy    Plan:    1.  Discontinue felodipine, discontinue Advil  2.  Change furosemide to 20 mg p.o. every other day or perhaps Monday-Wednesday-Friday  3.  Change losartan to half tablet p.o. twice daily  4.  Meloxicam 5 mg p.o. twice daily as needed for osteoarthritic pain to be taken only with food  5.  Tylenol 5 mg arthritis 1 tablet p.o. as needed for osteoarthritic pain  6.  Initiate physical therapy to help with balance and overall physical strengthening  7.  Continue all low-salt diet  8.  I recommend the following vitamins:  ? Vitamin D3 5000 units a day  ? Vitamin C 500 mg a day  ? Elemental zinc 50 mg a day  9.  Check a vitamin D3 level  10.  Evaluation with a 2D echo Doppler study  11.  Follow-up office visit in 8 to 10 weeks.      Total Time Today was 40 minutes in the following activities: Preparing to see the patient, Obtaining and/or reviewing separately obtained history, Performing a medically appropriate examination and/or evaluation, Counseling and educating the patient/family/caregiver, Ordering medications, tests, or procedures, Referring and communication with other health care professionals (when not separately reported), Documenting clinical information in the electronic or other health record, Independently interpreting results (not separately reported) and communicating results to the patient/family/caregiver and Care coordination (not separately reported)         Current Medications (including today's revisions)  ? acetaminophen (TYLENOL) 500 mg tablet Take one tablet by mouth as Needed for Pain. Max of 4,000 mg of acetaminophen in 24 hours.   ? aspirin EC 81 mg tablet Take one tablet by mouth daily.   ? atenoloL (TENORMIN) 25 mg tablet Take one-half tablet by mouth at bedtime daily.   ? cholecalciferol (Vitamin D3) (VITAMIN D-3) 1,000 units tablet Take one tablet by mouth daily.   ? esomeprazole DR(+) (NEXIUM) 40 mg capsule Take one capsule by mouth every morning. Take on an empty stomach at least 1 hour before or 2 hours after food.   ? felodipine (PLENDIL) 5 mg ER tablet TAKE 1 TABLET BY MOUTH DAILY. TAKE ON AN EMPTY STOMACH. (Patient taking differently: Take one tablet by mouth at bedtime daily. Take on an empty stomach.)   ? furosemide (LASIX) 40 mg tablet TAKE ONE TABLET BY MOUTH EVERY MORNING   ? losartan (COZAAR) 50 mg tablet TAKE 1 TABLET BY MOUTH TWICE A DAY (Patient taking differently: Take one-half tablet by mouth twice daily.)   ? magnesium oxide (MAG-OX) 400 mg tablet Take one tablet by mouth twice daily.   ? naproxen sodium (ALEVE) 220 mg tablet Take one tablet by mouth twice daily.   ? oxyCODONE (ROXICODONE) 5 mg tablet Take one tablet by mouth as Needed.   ? potassium chloride (KLOR-CON  10) 10 mEq tablet Take one tablet by mouth daily. Take with a meal and a full glass of water.   ? pravastatin (PRAVACHOL) 40 mg tablet TAKE 1 TABLET BY MOUTH EVERY DAY (Patient taking differently: Take one tablet by mouth at bedtime daily.)   ? senna (SENOKOT) 8.6 mg tablet Take one tablet by mouth twice daily. (Patient taking differently: Take one tablet by mouth daily.)   ? soy isofla/blk cohosh/mag bark (ESTROVEN PO) Take  by mouth daily.   ? temazepam (RESTORIL) 15 mg capsule Take two capsules by mouth at bedtime daily.

## 2021-12-02 ENCOUNTER — Ambulatory Visit: Admit: 2021-12-02 | Discharge: 2021-12-02 | Payer: MEDICARE | Primary: Family

## 2021-12-02 ENCOUNTER — Encounter: Admit: 2021-12-02 | Discharge: 2021-12-02 | Payer: MEDICARE | Primary: Family

## 2021-12-02 DIAGNOSIS — I214 Non-ST elevation (NSTEMI) myocardial infarction: Secondary | ICD-10-CM

## 2022-02-01 ENCOUNTER — Encounter: Admit: 2022-02-01 | Discharge: 2022-02-01 | Payer: MEDICARE | Primary: Family

## 2022-02-01 MED ORDER — POTASSIUM CHLORIDE 10 MEQ PO TBER
ORAL_TABLET | 3 refills
Start: 2022-02-01 — End: ?

## 2022-03-05 ENCOUNTER — Encounter: Admit: 2022-03-05 | Discharge: 2022-03-05 | Payer: MEDICARE | Primary: Family

## 2022-03-05 MED ORDER — MELOXICAM 7.5 MG PO TAB
7.5 mg | ORAL_TABLET | Freq: Two times a day (BID) | ORAL | 3 refills | 30.00000 days | Status: AC
Start: 2022-03-05 — End: ?

## 2022-03-27 ENCOUNTER — Ambulatory Visit: Admit: 2022-03-27 | Discharge: 2022-03-28 | Payer: MEDICARE | Primary: Family

## 2022-03-27 ENCOUNTER — Encounter: Admit: 2022-03-27 | Discharge: 2022-03-27 | Payer: MEDICARE | Primary: Family

## 2022-03-27 DIAGNOSIS — M5441 Lumbago with sciatica, right side: Secondary | ICD-10-CM

## 2022-03-27 DIAGNOSIS — Z955 Presence of coronary angioplasty implant and graft: Secondary | ICD-10-CM

## 2022-03-27 DIAGNOSIS — K219 Gastro-esophageal reflux disease without esophagitis: Secondary | ICD-10-CM

## 2022-03-27 DIAGNOSIS — I10 Essential (primary) hypertension: Secondary | ICD-10-CM

## 2022-03-27 DIAGNOSIS — E782 Mixed hyperlipidemia: Secondary | ICD-10-CM

## 2022-03-27 DIAGNOSIS — I214 Non-ST elevation (NSTEMI) myocardial infarction: Secondary | ICD-10-CM

## 2022-03-27 DIAGNOSIS — I2510 Atherosclerotic heart disease of native coronary artery without angina pectoris: Secondary | ICD-10-CM

## 2022-03-27 NOTE — Progress Notes
Date of Service: 03/27/2022    Carmen Jones is a 88 y.o. female.       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     In the past patient was treated with losartan and felodipine for hypertension.  At present time she takes felodipine only 5 mg p.o. nightly, this keeps her blood pressure under good control.  The previous dose of losartan 25 mg p.o. twice daily-she does not take that anymore.    Patient does have lower extremity edema, 1+ pretibially, she tries to follow a strict low-sodium diet.    She does report few days ago experiencing a brief episode of pain in the left jaw, it lasted less than a minute    She has not had any symptoms compatible with angina, has not taken sublingual nitroglycerin.    Patient was evaluated with a 2D echo Doppler study on 12/02/2021-normal LVEF, grade 1 diastolic dysfunction with elevated left atrial pressure, mild mitral annular calcification without stenosis, moderate MR, trace TR, mildly dilated aorta with normal aortic size index send mild pulmonary hypertension, estimated PAP = 37 mmHg       Vitals:    03/27/22 0920   BP: 134/68   BP Source: Arm, Left Upper   Pulse: 75   SpO2: 97%   O2 Device: None (Room air)   PainSc: Four   Weight: 77.8 kg (171 lb 9.6 oz)   Height: 165.1 cm (5' 5)     Body mass index is 28.56 kg/m?Marland Kitchen     Past Medical History  Patient Active Problem List    Diagnosis Date Noted    Presence of stent in LAD coronary artery 11/12/2021    S/P right coronary artery (RCA) stent placement 11/12/2021    Chronic right-sided low back pain with right-sided sciatica 11/12/2021    Hyponatremia 10/12/2018    Hypomagnesemia 10/12/2018    Precordial pain 09/25/2016    Primary insomnia 11/27/2014    Mixed hyperlipidemia 11/15/2013    Arteriosclerotic coronary artery disease 12/01/2011     10/2011  Chest pain.  transfer form [facility] with NSTEMI          Angiogram: LAD mid 95%, LCX-OMB1 40%, dom RCA 95%.     PCI with 3.0x18 Xience DES in LAD and 3.0x15 Xience DES in RCA  08/14 regadenoson thall:  EF 87%, no ischemia, normal scan  08/18 Atypical chest pain, regaden thall: EF 90%, no ischemia       NSTEMI (non-ST elevated myocardial infarction) (HCC) 11/06/2011    Essential hypertension 11/06/2011    GERD (gastroesophageal reflux disease) 11/06/2011         Review of Systems   Constitutional: Positive for malaise/fatigue.   HENT: Negative.     Eyes: Negative.    Cardiovascular:  Positive for irregular heartbeat.   Respiratory: Negative.     Endocrine: Negative.    Hematologic/Lymphatic: Negative.    Skin: Negative.    Musculoskeletal:  Positive for back pain.        Jaw pain   Gastrointestinal:  Positive for flatus and heartburn.   Genitourinary: Negative.    Neurological: Negative.    Psychiatric/Behavioral:  Negative.     Allergic/Immunologic: Negative.        Physical Exam    General Appearance: normal in appearance  Skin: warm, moist, no ulcers or xanthomas  Eyes: conjunctivae and lids normal, pupils are equal and round  Lips & Oral Mucosa: no pallor or cyanosis  Neck Veins: neck veins are flat, neck veins are not distended  Chest Inspection: chest is normal in appearance  Respiratory Effort: breathing comfortably, no respiratory distress  Auscultation/Percussion: lungs clear to auscultation, no rales or rhonchi, no wheezing  Cardiac Rhythm: regular rhythm and normal rate  Cardiac Auscultation: S1, S2 normal, no rub, no gallop  Murmurs: no murmur  Carotid Arteries: normal carotid upstroke bilaterally, no bruit  Lower Extremity Edema:  1+ bilateral, pretibial lower extremity edema   Abdominal Exam: soft, non-tender, no masses, bowel sounds normal  Liver & Spleen: no organomegaly  Language and Memory: patient responsive and seems to comprehend information  Neurologic Exam: neurological assessment grossly intact    Cardiovascular Studies      Cardiovascular Health Factors  Vitals BP Readings from Last 3 Encounters:   03/27/22 134/68   12/02/21 138/60   11/12/21 122/60     Wt Readings from Last 3 Encounters:   03/27/22 77.8 kg (171 lb 9.6 oz)   12/02/21 80.3 kg (177 lb)   11/12/21 76 kg (167 lb 9.6 oz)     BMI Readings from Last 3 Encounters:   03/27/22 28.56 kg/m?   12/02/21 29.45 kg/m?   11/12/21 28.77 kg/m?      Smoking Social History     Tobacco Use   Smoking Status Never   Smokeless Tobacco Never      Lipid Profile Cholesterol   Date Value Ref Range Status   04/18/2021 132  Final     HDL   Date Value Ref Range Status   04/18/2021 52  Final     LDL   Date Value Ref Range Status   04/18/2021 63  Final     Triglycerides   Date Value Ref Range Status   04/18/2021 87  Final      Blood Sugar Hemoglobin A1C   Date Value Ref Range Status   07/31/2017 5.8  Final     Glucose   Date Value Ref Range Status   04/18/2021 102  Final   04/05/2021 123 (H) 70 - 105 Final   05/04/2019 104  Final     Glucose, POC   Date Value Ref Range Status   11/05/2011 150 (H) 70 - 100 MG/DL Final          Problems Addressed Today  Encounter Diagnoses   Name Primary?    Arteriosclerotic coronary artery disease Yes    Essential hypertension     Mixed hyperlipidemia     NSTEMI (non-ST elevated myocardial infarction) (HCC)     S/P right coronary artery (RCA) stent placement     Presence of stent in LAD coronary artery     Gastroesophageal reflux disease, unspecified whether esophagitis present     Chronic right-sided low back pain with right-sided sciatica        Assessment and Plan       Assessment:    1.  Primary hypertension  Currently on felodipine 5 mg p.o. nightly, the blood pressure is well-controlled  Patient does not take losartan 25 mg p.o. twice daily  2.  Chronic HFpEF, bilateral lower extremity edema  This has been under good control with furosemide every other  day along with potassium replacement  3.  Coronary artery disease-no symptoms of angina and no use of sublingual nitroglycerin  4.  History of non-ST MI-no recurrence  5.  Status post LHC in September 2013  Patient underwent PCI of the LAD and RCA  6.  Chronic hyponatremia-this could represent a side effect of the loop diuretic  7.  Hyperlipidemia-on statin therapy  8.  Osteoarthritis, low back pain-patient does use meloxicam and Tylenol arthritis  9.  Valvular disorders  Moderate MR and trace TR  10.  Mild pulmonary hypertension-very likely due to chronic HF PEF, estimated PAP = 37 mmHg on the most recent study dated 12/02/2021.    Plan:    1.  I asked the patient to continue the current dose of felodipine, her blood pressure is well-controlled and she does not need to take losartan in addition.  She will continue furosemide and potassium replacement every other day  2.  Follow a strict low-sodium diet  3.  Follow-up office visit in 6 months      Total Time Today was 40 minutes in the following activities: Preparing to see the patient, Obtaining and/or reviewing separately obtained history, Performing a medically appropriate examination and/or evaluation, Counseling and educating the patient/family/caregiver, Ordering medications, tests, or procedures, Referring and communication with other health care professionals (when not separately reported), Documenting clinical information in the electronic or other health record, Independently interpreting results (not separately reported) and communicating results to the patient/family/caregiver, and Care coordination (not separately reported)          Current Medications (including today's revisions)   acetaminophen (TYLENOL EXTRA STRENGTH) 500 mg tablet Take one tablet by mouth every 8 hours as needed for Pain. Max of 4,000 mg of acetaminophen in 24 hours.  Indications: backache    ascorbic acid (vitamin C) 500 mg tablet Take one tablet by mouth daily.    aspirin EC 81 mg tablet Take one tablet by mouth daily.    atenoloL (TENORMIN) 25 mg tablet Take one-half tablet by mouth at bedtime daily.    cholecalciferol (Vitamin D3) (VITAMIN D-3) 1,000 units tablet Take one tablet by mouth daily.    esomeprazole DR(+) (NEXIUM) 40 mg capsule Take one capsule by mouth every morning. Take on an empty stomach at least 1 hour before or 2 hours after food.    felodipine (PLENDIL) 5 mg ER tablet Take one tablet by mouth at bedtime daily. Take on an empty stomach.    furosemide (LASIX) 40 mg tablet TAKE ONE TABLET BY MOUTH EVERY MORNING (Patient taking differently: Take one tablet by mouth every 48 hours.)    losartan (COZAAR) 50 mg tablet Take one-half tablet by mouth twice daily.    magnesium oxide (MAG-OX) 400 mg tablet Take one tablet by mouth twice daily.    meloxicam (MOBIC) 7.5 mg tablet TAKE ONE TABLET BY MOUTH TWICE DAILY    oxyCODONE (ROXICODONE) 5 mg tablet Take one tablet by mouth as Needed.    potassium chloride (KLOR-CON 10) 10 mEq tablet TAKE ONE TABLET BY MOUTH EVERY DAY. take with a meal and a full glass of water    pravastatin (PRAVACHOL) 40 mg tablet TAKE 1 TABLET BY MOUTH EVERY DAY (Patient taking differently: Take one tablet by mouth at bedtime daily.)    senna (SENOKOT) 8.6 mg tablet Take one tablet by mouth twice daily. (Patient taking differently: Take one tablet by mouth daily.)    soy isofla/blk cohosh/mag bark (ESTROVEN PO) Take  by mouth  daily.    temazepam (RESTORIL) 15 mg capsule Take two capsules by mouth at bedtime daily.    Zinc Gluconate 50 mg tab Take one tablet by mouth daily.

## 2022-04-02 ENCOUNTER — Encounter: Admit: 2022-04-02 | Discharge: 2022-04-02 | Payer: MEDICARE | Primary: Family

## 2022-04-02 MED ORDER — FELODIPINE 5 MG PO TB24
5 mg | ORAL_TABLET | Freq: Every day | ORAL | 3 refills | 90.00000 days | Status: AC
Start: 2022-04-02 — End: ?

## 2022-05-28 ENCOUNTER — Encounter: Admit: 2022-05-28 | Discharge: 2022-05-28 | Payer: MEDICARE | Primary: Family

## 2022-05-28 ENCOUNTER — Ambulatory Visit: Admit: 2022-05-28 | Discharge: 2022-05-29 | Payer: MEDICARE | Primary: Family

## 2022-05-28 DIAGNOSIS — Z955 Presence of coronary angioplasty implant and graft: Secondary | ICD-10-CM

## 2022-05-28 DIAGNOSIS — I10 Essential (primary) hypertension: Secondary | ICD-10-CM

## 2022-05-28 DIAGNOSIS — K219 Gastro-esophageal reflux disease without esophagitis: Secondary | ICD-10-CM

## 2022-05-28 DIAGNOSIS — K759 Inflammatory liver disease, unspecified: Secondary | ICD-10-CM

## 2022-05-28 DIAGNOSIS — I219 Acute myocardial infarction, unspecified: Secondary | ICD-10-CM

## 2022-05-28 DIAGNOSIS — K449 Diaphragmatic hernia without obstruction or gangrene: Secondary | ICD-10-CM

## 2022-05-28 DIAGNOSIS — M1990 Unspecified osteoarthritis, unspecified site: Secondary | ICD-10-CM

## 2022-05-28 DIAGNOSIS — E7800 Pure hypercholesterolemia, unspecified: Secondary | ICD-10-CM

## 2022-05-28 DIAGNOSIS — M5441 Lumbago with sciatica, right side: Secondary | ICD-10-CM

## 2022-08-14 ENCOUNTER — Encounter: Admit: 2022-08-14 | Discharge: 2022-08-14 | Payer: MEDICARE | Primary: Family

## 2022-08-14 MED ORDER — MELOXICAM 7.5 MG PO TAB
7.5 mg | ORAL_TABLET | Freq: Two times a day (BID) | ORAL | 3 refills
Start: 2022-08-14 — End: ?

## 2022-08-20 ENCOUNTER — Encounter: Admit: 2022-08-20 | Discharge: 2022-08-20 | Payer: MEDICARE | Primary: Family

## 2022-08-20 DIAGNOSIS — I10 Essential (primary) hypertension: Secondary | ICD-10-CM

## 2022-08-20 MED ORDER — FUROSEMIDE 40 MG PO TAB
40 mg | ORAL_TABLET | ORAL | 1 refills | 90.00000 days | Status: AC
Start: 2022-08-20 — End: ?

## 2022-08-20 NOTE — Progress Notes
08/20/2022 11:28 AM   Medical records request for continuation of care:    Please fax any labs from the last 6 months to Cardiovascular Medicine [facility] [phone].    Thank you,    [facility] [employer] [facility] -Cardiovascular Medicine                                                                     [facility] [id number] - [place] [address]   Phone [phone] - Fax [phone]     This is a note sent to your PCP or other healthcare provider to request records for continuation of care. NO ACTION REQUIRED FROM PATIENT.

## 2022-08-22 ENCOUNTER — Encounter: Admit: 2022-08-22 | Discharge: 2022-08-22 | Payer: MEDICARE | Primary: Family

## 2022-10-22 ENCOUNTER — Encounter: Admit: 2022-10-22 | Discharge: 2022-10-22 | Payer: MEDICARE | Primary: Family

## 2022-10-23 ENCOUNTER — Encounter: Admit: 2022-10-23 | Discharge: 2022-10-23 | Payer: MEDICARE

## 2022-10-23 ENCOUNTER — Ambulatory Visit: Admit: 2022-10-23 | Discharge: 2022-10-23 | Payer: MEDICARE

## 2022-10-23 DIAGNOSIS — K2960 Other gastritis without bleeding: Secondary | ICD-10-CM

## 2022-10-23 DIAGNOSIS — K219 Gastro-esophageal reflux disease without esophagitis: Secondary | ICD-10-CM

## 2022-10-23 DIAGNOSIS — E7800 Pure hypercholesterolemia, unspecified: Secondary | ICD-10-CM

## 2022-10-23 DIAGNOSIS — M5441 Lumbago with sciatica, right side: Secondary | ICD-10-CM

## 2022-10-23 DIAGNOSIS — I219 Acute myocardial infarction, unspecified: Secondary | ICD-10-CM

## 2022-10-23 DIAGNOSIS — E782 Mixed hyperlipidemia: Secondary | ICD-10-CM

## 2022-10-23 DIAGNOSIS — Z955 Presence of coronary angioplasty implant and graft: Secondary | ICD-10-CM

## 2022-10-23 DIAGNOSIS — I10 Essential (primary) hypertension: Secondary | ICD-10-CM

## 2022-10-23 DIAGNOSIS — I2510 Atherosclerotic heart disease of native coronary artery without angina pectoris: Secondary | ICD-10-CM

## 2022-10-23 DIAGNOSIS — K449 Diaphragmatic hernia without obstruction or gangrene: Secondary | ICD-10-CM

## 2022-10-23 DIAGNOSIS — K759 Inflammatory liver disease, unspecified: Secondary | ICD-10-CM

## 2022-10-23 DIAGNOSIS — I214 Non-ST elevation (NSTEMI) myocardial infarction: Secondary | ICD-10-CM

## 2022-10-23 DIAGNOSIS — E871 Hypo-osmolality and hyponatremia: Secondary | ICD-10-CM

## 2022-10-23 DIAGNOSIS — M1990 Unspecified osteoarthritis, unspecified site: Secondary | ICD-10-CM

## 2022-10-23 DIAGNOSIS — Z136 Encounter for screening for cardiovascular disorders: Secondary | ICD-10-CM

## 2022-10-23 NOTE — Progress Notes
Date of Service: 10/23/2022    Carmen Jones is a 88 y.o. female.       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    Patient has not experienced any anginal symptoms.    She does experience epigastric discomfort which very likely is due to the hiatal hernia.  She also has not had any symptoms of chest pain, no heart failure, no presyncope or syncope.    Sister Carmen Jones did bring with her today at the office visit diary of her blood pressure and heart rate.  At home the blood pressure is under good control.  Today in the office we did record 158/88 mmHg with a heart rate of 70 bpm.  At the previous office visit the losartan was discontinued, she has been continued on atenolol and felodipine.    Patient was evaluated with a 2D echo Doppler study on 12/02/2021-normal LVEF, grade 1 diastolic dysfunction with elevated left atrial pressure, mild mitral annular calcification without stenosis, moderate MR, trace TR, mildly dilated aorta with normal aortic size index send mild pulmonary hypertension, estimated PAP = 37 mmHg            Vitals:    10/23/22 0936   BP: (!) 156/88   BP Source: Arm, Left Upper   Pulse: 70   SpO2: 98%   O2 Device: None (Room air)   PainSc: Zero   Weight: 71.8 kg (158 lb 6.4 oz)   Height: 165.1 cm (5' 5)     Body mass index is 26.36 kg/m?Marland Kitchen     Past Medical History  Patient Active Problem List    Diagnosis Date Noted    Presence of stent in LAD coronary artery 11/12/2021    S/P right coronary artery (RCA) stent placement 11/12/2021    Chronic right-sided low back pain with right-sided sciatica 11/12/2021 Hyponatremia 10/12/2018    Hypomagnesemia 10/12/2018    Precordial pain 09/25/2016    Primary insomnia 11/27/2014    Mixed hyperlipidemia 11/15/2013    Arteriosclerotic coronary artery disease 12/01/2011     10/2011  Chest pain.  transfer form [facility] with NSTEMI          Angiogram: LAD mid 95%, LCX-OMB1 40%, dom RCA 95%.     PCI with 3.0x18 Xience DES in LAD and 3.0x15 Xience DES in RCA  08/14 regadenoson thall:  EF 87%, no ischemia, normal scan  08/18 Atypical chest pain, regaden thall: EF 90%, no ischemia       NSTEMI (non-ST elevated myocardial infarction) (HCC) 11/06/2011    Essential hypertension 11/06/2011    GERD (gastroesophageal reflux disease) 11/06/2011         Review of Systems   Constitutional: Negative.   HENT: Negative.     Eyes: Negative.    Cardiovascular: Negative.    Respiratory: Negative.     Endocrine: Negative.    Hematologic/Lymphatic: Negative.    Skin: Negative.    Musculoskeletal: Negative.    Gastrointestinal: Negative.    Genitourinary: Negative.    Neurological: Negative.    Psychiatric/Behavioral: Negative.     Allergic/Immunologic: Negative.  Physical Exam    General Appearance: normal in appearance  Skin: warm, moist, no ulcers or xanthomas  Eyes: conjunctivae and lids normal, pupils are equal and round  Lips & Oral Mucosa: no pallor or cyanosis  Neck Veins: neck veins are flat, neck veins are not distended  Chest Inspection: chest is normal in appearance  Respiratory Effort: breathing comfortably, no respiratory distress  Auscultation/Percussion: lungs clear to auscultation, no rales or rhonchi, no wheezing  Cardiac Rhythm: regular rhythm and normal rate  Cardiac Auscultation: S1, S2 normal, no rub, no gallop  Murmurs: no murmur  Carotid Arteries: normal carotid upstroke bilaterally, no bruit  Lower Extremity Edema: no lower extremity edema  Abdominal Exam: soft, non-tender, no masses, bowel sounds normal  Liver & Spleen: no organomegaly  Language and Memory: patient responsive and seems to comprehend information  Neurologic Exam: neurological assessment grossly intact      Cardiovascular Studies  Twelve-lead EKG demonstrates normal sinus rhythm, first-degree AV block, PR interval 264 ms, normal QT/QTc 380/388 ms, no axis deviation.    Cardiovascular Health Factors  Vitals BP Readings from Last 3 Encounters:   10/23/22 (!) 156/88   05/28/22 (!) 149/73   03/27/22 134/68     Wt Readings from Last 3 Encounters:   10/23/22 71.8 kg (158 lb 6.4 oz)   05/28/22 74.8 kg (165 lb)   03/27/22 77.8 kg (171 lb 9.6 oz)     BMI Readings from Last 3 Encounters:   10/23/22 26.36 kg/m?   05/28/22 27.46 kg/m?   03/27/22 28.56 kg/m?      Smoking Social History     Tobacco Use   Smoking Status Never   Smokeless Tobacco Never      Lipid Profile Cholesterol   Date Value Ref Range Status   07/25/2022 151  Final     HDL   Date Value Ref Range Status   07/25/2022 53  Final     LDL   Date Value Ref Range Status   07/25/2022 67  Final     Triglycerides   Date Value Ref Range Status   07/25/2022 155 (H) <150 Final      Blood Sugar Hemoglobin A1C   Date Value Ref Range Status   07/31/2017 5.8  Final     Glucose   Date Value Ref Range Status   07/25/2022 100  Final   04/18/2021 102  Final   04/05/2021 123 (H) 70 - 105 Final     Glucose, POC   Date Value Ref Range Status   11/05/2011 150 (H) 70 - 100 MG/DL Final          Problems Addressed Today  Encounter Diagnoses   Name Primary?    S/P right coronary artery (RCA) stent placement Yes    NSTEMI (non-ST elevated myocardial infarction) (HCC)     Mixed hyperlipidemia     Essential hypertension     Arteriosclerotic coronary artery disease     Presence of stent in LAD coronary artery     Hyponatremia     Hypomagnesemia     Gastroesophageal reflux disease, unspecified whether esophagitis present     Chronic right-sided low back pain with right-sided sciatica     Screening for heart disease        Assessment and Plan     Assessment:    Assessment:     1.  Primary hypertension  Currently on felodipine 5 mg p.o. nightly, the blood pressure is well-controlled at home, today in  the office we did record an elevated blood pressure, but this is not what her home diary reflects  In addition, patient takes atenolol 25 mg p.o. daily  Losartan was discontinued  2.  Chronic HFpEF, bilateral lower extremity edema  This has been under good control with furosemide every other day along with potassium replacement  3.  Coronary artery disease-no symptoms of angina and no use of sublingual nitroglycerin  4.  History of non-ST MI-no recurrence  5.  Status post LHC in September 2013  Patient underwent PCI of the LAD and RCA  6.  Chronic hyponatremia-this could represent a side effect of the loop diuretic  7.  Hyperlipidemia-on statin therapy  8.  Osteoarthritis, low back pain-patient does use meloxicam and Tylenol arthritis  9.  Valvular disorders  Moderate MR and trace TR  10.  Mild pulmonary hypertension-very likely due to chronic HF PEF, estimated PAP = 37 mmHg on the most recent study dated 12/02/2021.  11.  Hiatal hernia  Patient does have symptoms related to these    Plan:    1.  Continue current regimen  2.  I did recommend to increase the level of physical activity, walking, light and sun exposure, continue vitamin D3  3.  Check vitamin D3 level  4.  Further referral to the GI department at [facility] for further evaluation of the hiatal hernia.      Total Time Today was 40 minutes in the following activities: Preparing to see the patient, Obtaining and/or reviewing separately obtained history, Performing a medically appropriate examination and/or evaluation, Counseling and educating the patient/family/caregiver, Ordering medications, tests, or procedures, Referring and communication with other health care professionals (when not separately reported), Documenting clinical information in the electronic or other health record, Independently interpreting results (not separately reported) and communicating results to the patient/family/caregiver, and Care coordination (not separately reported)          Current Medications (including today's revisions)   acetaminophen (TYLENOL EXTRA STRENGTH) 500 mg tablet Take one tablet by mouth every 8 hours as needed for Pain. Max of 4,000 mg of acetaminophen in 24 hours.  Indications: backache    ascorbic acid (vitamin C) 500 mg tablet Take one tablet by mouth daily.    aspirin EC 81 mg tablet Take one tablet by mouth daily.    atenoloL (TENORMIN) 25 mg tablet Take one-half tablet by mouth at bedtime daily.    cholecalciferol (Vitamin D3) (VITAMIN D-3) 1,000 units tablet Take one tablet by mouth daily.    esomeprazole DR(+) (NEXIUM) 40 mg capsule Take one capsule by mouth every morning. Take on an empty stomach at least 1 hour before or 2 hours after food.    felodipine (PLENDIL) 5 mg ER tablet TAKE ONE TABLET BY MOUTH EVERY DAY    furosemide (LASIX) 40 mg tablet Take one tablet by mouth every 48 hours.    losartan (COZAAR) 50 mg tablet Take one-half tablet by mouth twice daily.    magnesium oxide (MAG-OX) 400 mg tablet Take one tablet by mouth twice daily.    meloxicam (MOBIC) 7.5 mg tablet TAKE ONE TABLET BY MOUTH TWICE DAILY    oxyCODONE (ROXICODONE) 5 mg tablet Take one tablet by mouth as Needed.    potassium chloride (KLOR-CON 10) 10 mEq tablet TAKE ONE TABLET BY MOUTH EVERY DAY. take with a meal and a full glass of water    pravastatin (PRAVACHOL) 40 mg tablet TAKE 1 TABLET BY MOUTH EVERY DAY (Patient taking differently: Take  one tablet by mouth at bedtime daily.)    senna (SENOKOT) 8.6 mg tablet Take one tablet by mouth twice daily. (Patient taking differently: Take one tablet by mouth daily.)    soy isofla/blk cohosh/mag bark (ESTROVEN PO) Take  by mouth daily.    temazepam (RESTORIL) 15 mg capsule Take two capsules by mouth at bedtime daily.    Zinc Gluconate 50 mg tab Take one tablet by mouth daily.

## 2023-01-06 ENCOUNTER — Encounter: Admit: 2023-01-06 | Discharge: 2023-01-06 | Payer: MEDICARE

## 2023-01-29 ENCOUNTER — Encounter: Admit: 2023-01-29 | Discharge: 2023-01-29 | Payer: MEDICARE

## 2023-01-29 MED ORDER — POTASSIUM CHLORIDE 10 MEQ PO TBER
ORAL_TABLET | 3 refills | 30.00000 days | Status: AC
Start: 2023-01-29 — End: ?

## 2023-02-05 ENCOUNTER — Encounter: Admit: 2023-02-05 | Discharge: 2023-02-05 | Payer: MEDICARE

## 2023-02-05 DIAGNOSIS — E782 Mixed hyperlipidemia: Secondary | ICD-10-CM

## 2023-02-05 DIAGNOSIS — I10 Essential (primary) hypertension: Secondary | ICD-10-CM

## 2023-02-05 MED ORDER — FUROSEMIDE 40 MG PO TAB
40 mg | ORAL_TABLET | ORAL | 1 refills | 90.00000 days | Status: AC
Start: 2023-02-05 — End: ?

## 2023-04-30 ENCOUNTER — Encounter: Admit: 2023-04-30 | Discharge: 2023-04-30 | Payer: MEDICARE

## 2023-04-30 ENCOUNTER — Ambulatory Visit: Admit: 2023-04-30 | Discharge: 2023-04-30 | Payer: MEDICARE

## 2023-04-30 DIAGNOSIS — I10 Essential (primary) hypertension: Secondary | ICD-10-CM

## 2023-04-30 DIAGNOSIS — E782 Mixed hyperlipidemia: Secondary | ICD-10-CM

## 2023-04-30 LAB — LIPID PROFILE
CHOLESTEROL/HDL %: 3
CHOLESTEROL: 166
HDL: 55
LDL: 85
TRIGLYCERIDES: 132
VLDL: 26

## 2023-04-30 LAB — COMPREHENSIVE METABOLIC PANEL
ALBUMIN: 3.7
ALK PHOSPHATASE: 80
ALT: 10
ANION GAP: 8
AST: 15
CALCIUM: 9.4
GLUCOSE,PANEL: 80
TOTAL BILIRUBIN: 0.6
TOTAL PROTEIN: 6.9

## 2023-05-04 ENCOUNTER — Encounter: Admit: 2023-05-04 | Discharge: 2023-05-04 | Payer: MEDICARE

## 2023-05-04 NOTE — Telephone Encounter
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    Thank you  ----- Message -----  From: Jesse Sans, MD  Sent: 04/30/2023   1:20 PM CDT  To: Dorris Fetch, MD

## 2023-05-04 NOTE — Telephone Encounter
 Sent mychart message with results and recommendations.

## 2023-05-07 ENCOUNTER — Encounter: Admit: 2023-05-07 | Discharge: 2023-05-07 | Payer: MEDICARE

## 2023-05-07 ENCOUNTER — Ambulatory Visit: Admit: 2023-05-07 | Discharge: 2023-05-08 | Payer: MEDICARE

## 2023-05-07 DIAGNOSIS — E871 Hypo-osmolality and hyponatremia: Secondary | ICD-10-CM

## 2023-05-07 DIAGNOSIS — F5101 Primary insomnia: Secondary | ICD-10-CM

## 2023-05-07 DIAGNOSIS — Z136 Encounter for screening for cardiovascular disorders: Secondary | ICD-10-CM

## 2023-05-07 DIAGNOSIS — E782 Mixed hyperlipidemia: Secondary | ICD-10-CM

## 2023-05-07 DIAGNOSIS — I214 Non-ST elevation (NSTEMI) myocardial infarction: Secondary | ICD-10-CM

## 2023-05-07 DIAGNOSIS — M5441 Lumbago with sciatica, right side: Secondary | ICD-10-CM

## 2023-05-07 DIAGNOSIS — K219 Gastro-esophageal reflux disease without esophagitis: Secondary | ICD-10-CM

## 2023-05-07 DIAGNOSIS — I2510 Atherosclerotic heart disease of native coronary artery without angina pectoris: Secondary | ICD-10-CM

## 2023-05-07 DIAGNOSIS — I10 Essential (primary) hypertension: Secondary | ICD-10-CM

## 2023-05-07 DIAGNOSIS — Z955 Presence of coronary angioplasty implant and graft: Secondary | ICD-10-CM

## 2023-07-31 ENCOUNTER — Encounter: Admit: 2023-07-31 | Discharge: 2023-07-31 | Payer: MEDICARE

## 2023-07-31 MED ORDER — FUROSEMIDE 40 MG PO TAB
40 mg | ORAL_TABLET | ORAL | 1 refills | 90.00000 days | Status: AC
Start: 2023-07-31 — End: ?

## 2023-11-30 ENCOUNTER — Encounter: Admit: 2023-11-30 | Discharge: 2023-11-30 | Payer: MEDICARE

## 2023-11-30 MED ORDER — POTASSIUM CHLORIDE 10 MEQ PO TBER
ORAL_TABLET | 3 refills | 30.00000 days | Status: AC
Start: 2023-11-30 — End: ?

## 2023-12-09 ENCOUNTER — Encounter: Admit: 2023-12-09 | Discharge: 2023-12-09 | Payer: MEDICARE

## 2023-12-09 ENCOUNTER — Ambulatory Visit: Admit: 2023-12-09 | Discharge: 2023-12-09 | Payer: MEDICARE
